# Patient Record
Sex: Female | Born: 1955 | Hispanic: No | Smoking: Never smoker
Health system: Southern US, Community
[De-identification: ages and names within clinical notes are randomized; demographics above are authoritative.]

## PROBLEM LIST (undated history)

## (undated) DIAGNOSIS — K449 Diaphragmatic hernia without obstruction or gangrene: Secondary | ICD-10-CM

## (undated) DIAGNOSIS — R32 Unspecified urinary incontinence: Secondary | ICD-10-CM

## (undated) DIAGNOSIS — E876 Hypokalemia: Secondary | ICD-10-CM

## (undated) DIAGNOSIS — J309 Allergic rhinitis, unspecified: Secondary | ICD-10-CM

## (undated) DIAGNOSIS — M25561 Pain in right knee: Secondary | ICD-10-CM

## (undated) DIAGNOSIS — M810 Age-related osteoporosis without current pathological fracture: Secondary | ICD-10-CM

## (undated) DIAGNOSIS — R609 Edema, unspecified: Secondary | ICD-10-CM

## (undated) DIAGNOSIS — E7800 Pure hypercholesterolemia, unspecified: Secondary | ICD-10-CM

## (undated) DIAGNOSIS — B0229 Other postherpetic nervous system involvement: Secondary | ICD-10-CM

## (undated) DIAGNOSIS — F32A Depression, unspecified: Secondary | ICD-10-CM

## (undated) DIAGNOSIS — R000 Tachycardia, unspecified: Secondary | ICD-10-CM

## (undated) DIAGNOSIS — I10 Essential (primary) hypertension: Secondary | ICD-10-CM

## (undated) DIAGNOSIS — K635 Polyp of colon: Secondary | ICD-10-CM

## (undated) DIAGNOSIS — K219 Gastro-esophageal reflux disease without esophagitis: Secondary | ICD-10-CM

## (undated) DIAGNOSIS — D509 Iron deficiency anemia, unspecified: Secondary | ICD-10-CM

## (undated) HISTORY — DX: Age-related osteoporosis without current pathological fracture: M81.0

## (undated) HISTORY — DX: Essential (primary) hypertension: I10

## (undated) HISTORY — DX: Pure hypercholesterolemia, unspecified: E78.00

## (undated) HISTORY — DX: Hypokalemia: E87.6

## (undated) HISTORY — DX: Diaphragmatic hernia without obstruction or gangrene: K44.9

## (undated) HISTORY — DX: Edema, unspecified: R60.9

## (undated) HISTORY — DX: Iron deficiency anemia, unspecified: D50.9

## (undated) HISTORY — DX: Unspecified urinary incontinence: R32

## (undated) HISTORY — DX: Polyp of colon: K63.5

## (undated) HISTORY — DX: Allergic rhinitis, unspecified: J30.9

## (undated) HISTORY — PX: TUBAL LIGATION: SHX77

## (undated) HISTORY — DX: Pain in right knee: M25.561

## (undated) HISTORY — DX: Gastro-esophageal reflux disease without esophagitis: K21.9

## (undated) HISTORY — DX: Tachycardia, unspecified: R00.0

## (undated) HISTORY — PX: CHOLECYSTECTOMY: SHX55

## (undated) HISTORY — DX: Depression, unspecified: F32.A

## (undated) HISTORY — DX: Other postherpetic nervous system involvement: B02.29

---

## 2001-02-24 ENCOUNTER — Other Ambulatory Visit: Admission: RE | Admit: 2001-02-24 | Discharge: 2001-02-24 | Payer: Self-pay | Admitting: Family Medicine

## 2002-03-30 ENCOUNTER — Other Ambulatory Visit: Admission: RE | Admit: 2002-03-30 | Discharge: 2002-03-30 | Payer: Self-pay | Admitting: Family Medicine

## 2003-04-05 ENCOUNTER — Other Ambulatory Visit: Admission: RE | Admit: 2003-04-05 | Discharge: 2003-04-05 | Payer: Self-pay | Admitting: Family Medicine

## 2004-08-14 ENCOUNTER — Other Ambulatory Visit: Admission: RE | Admit: 2004-08-14 | Discharge: 2004-08-14 | Payer: Self-pay | Admitting: Family Medicine

## 2005-08-27 ENCOUNTER — Other Ambulatory Visit: Admission: RE | Admit: 2005-08-27 | Discharge: 2005-08-27 | Payer: Self-pay | Admitting: Family Medicine

## 2021-01-27 ENCOUNTER — Encounter: Payer: Self-pay | Admitting: Gastroenterology

## 2021-05-21 ENCOUNTER — Other Ambulatory Visit: Payer: Self-pay

## 2021-05-21 DIAGNOSIS — M810 Age-related osteoporosis without current pathological fracture: Secondary | ICD-10-CM | POA: Insufficient documentation

## 2021-05-21 DIAGNOSIS — R000 Tachycardia, unspecified: Secondary | ICD-10-CM | POA: Insufficient documentation

## 2021-05-21 DIAGNOSIS — B0229 Other postherpetic nervous system involvement: Secondary | ICD-10-CM | POA: Insufficient documentation

## 2021-05-21 DIAGNOSIS — I10 Essential (primary) hypertension: Secondary | ICD-10-CM | POA: Insufficient documentation

## 2021-05-21 DIAGNOSIS — E7800 Pure hypercholesterolemia, unspecified: Secondary | ICD-10-CM | POA: Insufficient documentation

## 2021-05-21 DIAGNOSIS — J309 Allergic rhinitis, unspecified: Secondary | ICD-10-CM | POA: Insufficient documentation

## 2021-05-21 DIAGNOSIS — M25562 Pain in left knee: Secondary | ICD-10-CM | POA: Insufficient documentation

## 2021-05-21 DIAGNOSIS — K449 Diaphragmatic hernia without obstruction or gangrene: Secondary | ICD-10-CM | POA: Insufficient documentation

## 2021-05-21 DIAGNOSIS — R32 Unspecified urinary incontinence: Secondary | ICD-10-CM | POA: Insufficient documentation

## 2021-05-21 DIAGNOSIS — M25561 Pain in right knee: Secondary | ICD-10-CM | POA: Insufficient documentation

## 2021-05-21 DIAGNOSIS — D509 Iron deficiency anemia, unspecified: Secondary | ICD-10-CM | POA: Insufficient documentation

## 2021-05-21 DIAGNOSIS — F32A Depression, unspecified: Secondary | ICD-10-CM | POA: Insufficient documentation

## 2021-05-21 DIAGNOSIS — E876 Hypokalemia: Secondary | ICD-10-CM | POA: Insufficient documentation

## 2021-05-21 DIAGNOSIS — R609 Edema, unspecified: Secondary | ICD-10-CM | POA: Insufficient documentation

## 2021-05-21 DIAGNOSIS — K219 Gastro-esophageal reflux disease without esophagitis: Secondary | ICD-10-CM | POA: Insufficient documentation

## 2021-05-21 DIAGNOSIS — K635 Polyp of colon: Secondary | ICD-10-CM | POA: Insufficient documentation

## 2021-06-02 ENCOUNTER — Ambulatory Visit (INDEPENDENT_AMBULATORY_CARE_PROVIDER_SITE_OTHER): Payer: 59 | Admitting: Cardiology

## 2021-06-02 ENCOUNTER — Other Ambulatory Visit: Payer: Self-pay

## 2021-06-02 ENCOUNTER — Ambulatory Visit (INDEPENDENT_AMBULATORY_CARE_PROVIDER_SITE_OTHER): Payer: 59

## 2021-06-02 ENCOUNTER — Encounter: Payer: Self-pay | Admitting: Cardiology

## 2021-06-02 DIAGNOSIS — R0609 Other forms of dyspnea: Secondary | ICD-10-CM

## 2021-06-02 DIAGNOSIS — R002 Palpitations: Secondary | ICD-10-CM

## 2021-06-02 DIAGNOSIS — I471 Supraventricular tachycardia, unspecified: Secondary | ICD-10-CM

## 2021-06-02 DIAGNOSIS — E66811 Obesity, class 1: Secondary | ICD-10-CM

## 2021-06-02 DIAGNOSIS — R011 Cardiac murmur, unspecified: Secondary | ICD-10-CM

## 2021-06-02 DIAGNOSIS — E669 Obesity, unspecified: Secondary | ICD-10-CM

## 2021-06-02 DIAGNOSIS — R0600 Dyspnea, unspecified: Secondary | ICD-10-CM

## 2021-06-02 HISTORY — DX: Cardiac murmur, unspecified: R01.1

## 2021-06-02 HISTORY — DX: Other forms of dyspnea: R06.09

## 2021-06-02 HISTORY — DX: Obesity, unspecified: E66.9

## 2021-06-02 HISTORY — DX: Palpitations: R00.2

## 2021-06-02 HISTORY — DX: Supraventricular tachycardia, unspecified: I47.10

## 2021-06-02 HISTORY — DX: Obesity, class 1: E66.811

## 2021-06-02 HISTORY — DX: Supraventricular tachycardia: I47.1

## 2021-06-02 NOTE — Progress Notes (Signed)
Cardiology Office Note:    Date:  06/02/2021   ID:  Charlene Hudson, DOB [birth year], MRN [id number]  PCP:  Lonie Peak, PA-C  Cardiologist:  Garwin Brothers, MD   Referring MD: Lonie Peak, PA-C    ASSESSMENT:    1. Palpitations   2. Dyspnea on exertion   3. Obesity (BMI 30.0-34.9)   4. Cardiac murmur   5. PSVT (paroxysmal supraventricular tachycardia) (HCC)    PLAN:    In order of problems listed above:  Primary prevention stressed with the patient.  Importance of compliance with diet medication stressed and she vocalized understanding. Dyspnea on exertion: This could be an anginal equivalent.  She has multiple risk factors for coronary artery disease so we will do an exercise stress Cardiolite and she is agreeable. Cardiac murmur: Echocardiogram will be done to assess murmur heard on auscultation. Paroxysmal supraventricular tachycardia: We will try to get a copy of the EKG from primary care.  At any we will do a 2-week monitoring.  I will also like to see what her TSH looks like and lab work has also been requested.  From primary care office.  I asked her to consider buying a cardia app for evaluation of the symptoms especially since she gets them sporadically.  She is agreeable. Obesity: Weight reduction stressed diet emphasized and risks of obesity explained and she plans to do better. Patient will be seen in follow-up appointment in 6 months or earlier if the patient has any concerns    Medication Adjustments/Labs and Tests Ordered: Current medicines are reviewed at length with the patient today.  Concerns regarding medicines are outlined above.  No orders of the defined types were placed in this encounter.  No orders of the defined types were placed in this encounter.    History of Present Illness:    Charlene Hudson is a 65 y.o. female who is being seen today for the evaluation of palpitations and dyspnea on exertion at the request of Lonie Peak, Cordelia Poche.   Patient is a pleasant 65 year old female.  She has past medical history that is not much significant.  She was found to have supraventricular tachycardia that responded to carotid massage at primary care provider's office.  Therefore she is sent here for evaluation.  I do not have the EKG tracings from primary care and I am trying to get a copy of it.  She denies any chest pain orthopnea or PND.  No syncope.  She has palpitations occasionally.  She has dyspnea on exertion and this has been happening for the past few months.  At the time of my evaluation, the patient is alert awake oriented and in no distress.  Past Medical History:  Diagnosis Date   Allergic rhinitis    Depression    Edema    GERD (gastroesophageal reflux disease)    Hypertension, essential, benign    Hypokalemia    Intestinal polyp    Iron deficiency anemia    Knee pain, bilateral    Osteoporosis    Postherpetic neuralgia    Pure hypercholesterolemia    Sliding hiatal hernia    Tachycardia    Urinary incontinence     Past Surgical History:  Procedure Laterality Date   CHOLECYSTECTOMY     TUBAL LIGATION      Current Medications: Current Meds  Medication Sig   albuterol (VENTOLIN HFA) 108 (90 Base) MCG/ACT inhaler Inhale 2 puffs into the lungs every 4 (four) hours as needed for wheezing  or shortness of breath.   B Complex-C (SUPER B COMPLEX PO) Take 1 capsule by mouth daily.   Biotin 5000 MCG CAPS Take 5,000 mcg by mouth daily.   buPROPion (WELLBUTRIN XL) 150 MG 24 hr tablet Take 150 mg by mouth daily.   Calcium Carbonate-Vitamin D (CALCIUM-D PO) Take 1 capsule by mouth daily.   cholecalciferol (VITAMIN D3) 25 MCG (1000 UNIT) tablet Take 1,000 Units by mouth daily.   clotrimazole-betamethasone (LOTRISONE) cream Apply 1 application topically 2 (two) times daily.   DULoxetine (CYMBALTA) 60 MG capsule Take 60 mg by mouth daily.   fluticasone (FLONASE) 50 MCG/ACT nasal spray Place 2 sprays into both nostrils daily.    furosemide (LASIX) 40 MG tablet Take 40 mg by mouth daily.   ibuprofen (ADVIL) 800 MG tablet Take 800 mg by mouth 3 (three) times daily as needed for pain.   meclizine (ANTIVERT) 25 MG tablet Take 25 mg by mouth daily as needed for dizziness.   metoprolol succinate (TOPROL-XL) 25 MG 24 hr tablet Take 25 mg by mouth daily.   montelukast (SINGULAIR) 10 MG tablet Take 10 mg by mouth daily.   Multiple Vitamin (MULTIVITAMIN) tablet Take 1 tablet by mouth daily.   Omega-3 Fatty Acids (FISH OIL) 1200 MG CAPS Take 1,400 mg by mouth daily.   oxybutynin (DITROPAN) 5 MG tablet Take 5 mg by mouth daily.   pantoprazole (PROTONIX) 40 MG tablet Take 40 mg by mouth 2 (two) times daily.   potassium chloride SA (KLOR-CON) 20 MEQ tablet Take 20 mEq by mouth 2 (two) times daily.   pregabalin (LYRICA) 200 MG capsule Take 200 mg by mouth 2 (two) times daily.     Allergies:   Patient has no known allergies.   Social History   Socioeconomic History   Marital status: Unknown    Spouse name: Not on file   Number of children: Not on file   Years of education: Not on file   Highest education level: Not on file  Occupational History   Not on file  Tobacco Use   Smoking status: Never   Smokeless tobacco: Never  Substance and Sexual Activity   Alcohol use: Not on file   Drug use: Not on file   Sexual activity: Not on file  Other Topics Concern   Not on file  Social History Narrative   Not on file   Social Determinants of Health   Financial Resource Strain: Not on file  Food Insecurity: Not on file  Transportation Needs: Not on file  Physical Activity: Not on file  Stress: Not on file  Social Connections: Not on file     Family History: The patient's family history includes Cancer in her maternal grandfather; Heart attack in her father; Hypertension in her brother, brother, and sister. There is no history of Heart disease or Diabetes.  ROS:   Please see the history of present illness.    All  other systems reviewed and are negative.  EKGs/Labs/Other Studies Reviewed:    The following studies were reviewed today: EKG reveals sinus rhythm and nonspecific ST-T changes   Recent Labs: No results found for requested labs within last 8760 hours.  Recent Lipid Panel No results found for: CHOL, TRIG, HDL, CHOLHDL, VLDL, LDLCALC, LDLDIRECT  Physical Exam:    VS:  BP 118/80   Pulse 66   Ht 5\' 3"  (1.6 m)   Wt 177 lb 6.4 oz (80.5 kg)   SpO2 94%   BMI 31.42 kg/m  Wt Readings from Last 3 Encounters:  06/02/21 177 lb 6.4 oz (80.5 kg)     GEN: Patient is in no acute distress HEENT: Normal NECK: No JVD; No carotid bruits LYMPHATICS: No lymphadenopathy CARDIAC: S1 S2 regular, 2/6 systolic murmur at the apex. RESPIRATORY:  Clear to auscultation without rales, wheezing or rhonchi  ABDOMEN: Soft, non-tender, non-distended MUSCULOSKELETAL:  No edema; No deformity  SKIN: Warm and dry NEUROLOGIC:  Alert and oriented x 3 PSYCHIATRIC:  Normal affect    Signed, Garwin Brothers, MD  06/02/2021 3:12 PM    [facility]

## 2021-06-02 NOTE — Patient Instructions (Signed)
Medication Instructions:  Your physician recommends that you continue on your current medications as directed. Please refer to the Current Medication list given to you today.  *If you need a refill on your cardiac medications before your next appointment, please call your pharmacy*   Lab Work: None ordered If you have labs (blood work) drawn today and your tests are completely normal, you will receive your results only by: MyChart Message (if you have MyChart) OR A paper copy in the mail If you have any lab test that is abnormal or we need to change your treatment, we will call you to review the results.   Testing/Procedures: Your physician has requested that you have a lexiscan myoview. For further information please visit [email or web address] Please follow instruction sheet, as given.  The test will take approximately 3 to 4 hours to complete; you may bring reading material.  If someone comes with you to your appointment, they will need to remain in the main lobby due to limited space in the testing area.   How to prepare for your Myocardial Perfusion Test: Do not eat or drink 3 hours prior to your test, except you may have water. Do not consume products containing caffeine (regular or decaffeinated) 12 hours prior to your test. (ex: coffee, chocolate, sodas, tea). Do bring a list of your current medications with you.  If not listed below, you may take your medications as normal. Hold your Metoprolol succinate (Toprol XL) Do wear comfortable clothes (no dresses or overalls) and walking shoes, tennis shoes preferred (No heels or open toe shoes are allowed). Do NOT wear cologne, perfume, aftershave, or lotions (deodorant is allowed). If these instructions are not followed, your test will have to be rescheduled.  Your physician has requested that you have an echocardiogram. Echocardiography is a painless test that uses sound waves to create images of your heart. It provides your doctor  with information about the size and shape of your heart and how well your heart's chambers and valves are working. This procedure takes approximately one hour. There are no restrictions for this procedure.  Wear the monitor for 14 days, remove 06/16/21.  Follow-Up: At [facility], you and your health needs are our priority.  As part of our continuing mission to provide you with exceptional heart care, we have created designated Provider Care Teams.  These Care Teams include your primary Cardiologist (physician) and Advanced Practice Providers (APPs -  Physician Assistants and Nurse Practitioners) who all work together to provide you with the care you need, when you need it.  We recommend signing up for the patient portal called "MyChart".  Sign up information is provided on this After Visit Summary.  MyChart is used to connect with patients for Virtual Visits (Telemedicine).  Patients are able to view lab/test results, encounter notes, upcoming appointments, etc.  Non-urgent messages can be sent to your provider as well.   To learn more about what you can do with MyChart, go to [email or web address].    Your next appointment:   6 month(s)  The format for your next appointment:   In Person  Provider:   Belva Crome, MD   Other Instructions Cardiac Nuclear Scan A cardiac nuclear scan is a test that is done to check the flow of blood to your heart. It is done when you are resting and when you are exercising. The test looks for problems such as: Not enough blood reaching a portion of the heart. The heart muscle not working as  it should. You may need this test if: You have heart disease. You have had lab results that are not normal. You have had heart surgery or a balloon procedure to open up blocked arteries (angioplasty). You have chest pain. You have shortness of breath. In this test, a special dye (tracer) is put into your bloodstream. The tracer will travel to your heart. A  camera will then take pictures of your heart to see how the tracer moves through your heart. This test is usually done at a hospital and takes 2-4 hours. Tell a doctor about: Any allergies you have. All medicines you are taking, including vitamins, herbs, eye drops, creams, and over-the-counter medicines. Any problems you or family members have had with anesthetic medicines. Any blood disorders you have. Any surgeries you have had. Any medical conditions you have. Whether you are pregnant or may be pregnant. What are the risks? Generally, this is a safe test. However, problems may occur, such as: Serious chest pain and heart attack. This is only a risk if the stress portion of the test is done. Rapid heartbeat. A feeling of warmth in your chest. This feeling usually does not last long. Allergic reaction to the tracer. What happens before the test? Ask your doctor about changing or stopping your normal medicines. This is important. Follow instructions from your doctor about what you cannot eat or drink. Remove your jewelry on the day of the test. What happens during the test? An IV tube will be inserted into one of your veins. Your doctor will give you a small amount of tracer through the IV tube. You will wait for 20-40 minutes while the tracer moves through your bloodstream. Your heart will be monitored with an electrocardiogram (ECG). You will lie down on an exam table. Pictures of your heart will be taken for about 15-20 minutes. You may also have a stress test. For this test, one of these things may be done: You will be asked to exercise on a treadmill or a stationary bike. You will be given medicines that will make your heart work harder. This is done if you are unable to exercise. When blood flow to your heart has peaked, a tracer will again be given through the IV tube. After 20-40 minutes, you will get back on the exam table. More pictures will be taken of your heart. Depending  on the tracer that is used, more pictures may need to be taken 3-4 hours later. Your IV tube will be removed when the test is over. The test may vary among doctors and hospitals. What happens after the test? Ask your doctor: Whether you can return to your normal schedule, including diet, activities, and medicines. Whether you should drink more fluids. This will help to remove the tracer from your body. Drink enough fluid to keep your pee (urine) pale yellow. Ask your doctor, or the department that is doing the test: When will my results be ready? How will I get my results? Summary A cardiac nuclear scan is a test that is done to check the flow of blood to your heart. Tell your doctor whether you are pregnant or may be pregnant. Before the test, ask your doctor about changing or stopping your normal medicines. This is important. Ask your doctor whether you can return to your normal activities. You may be asked to drink more fluids. This information is not intended to replace advice given to you by your health care provider. Make sure you discuss any   questions you have with your health care provider. Document Revised: 12/27/2018 Document Reviewed: 02/20/2018 Elsevier Patient Education  2021 Elsevier Inc.    Echocardiogram An echocardiogram is a test that uses sound waves (ultrasound) to produce images of the heart. Images from an echocardiogram can provide important information about: Heart size and shape. The size and thickness and movement of your heart's walls. Heart muscle function and strength. Heart valve function or if you have stenosis. Stenosis is when the heart valves are too narrow. If blood is flowing backward through the heart valves (regurgitation). A tumor or infectious growth around the heart valves. Areas of heart muscle that are not working well because of poor blood flow or injury from a heart attack. Aneurysm detection. An aneurysm is a weak or damaged part of an  artery wall. The wall bulges out from the normal force of blood pumping through the body. Tell a health care provider about: Any allergies you have. All medicines you are taking, including vitamins, herbs, eye drops, creams, and over-the-counter medicines. Any blood disorders you have. Any surgeries you have had. Any medical conditions you have. Whether you are pregnant or may be pregnant. What are the risks? Generally, this is a safe test. However, problems may occur, including an allergic reaction to dye (contrast) that may be used during the test. What happens before the test? No specific preparation is needed. You may eat and drink normally. What happens during the test? You will take off your clothes from the waist up and put on a hospital gown. Electrodes or electrocardiogram (ECG)patches may be placed on your chest. The electrodes or patches are then connected to a device that monitors your heart rate and rhythm. You will lie down on a table for an ultrasound exam. A gel will be applied to your chest to help sound waves pass through your skin. A handheld device, called a transducer, will be pressed against your chest and moved over your heart. The transducer produces sound waves that travel to your heart and bounce back (or "echo" back) to the transducer. These sound waves will be captured in real-time and changed into images of your heart that can be viewed on a video monitor. The images will be recorded on a computer and reviewed by your health care provider. You may be asked to change positions or hold your breath for a short time. This makes it easier to get different views or better views of your heart. In some cases, you may receive contrast through an IV in one of your veins. This can improve the quality of the pictures from your heart. The procedure may vary among health care providers and hospitals.    What can I expect after the test? You may return to your normal, everyday  life, including diet, activities, and medicines, unless your health care provider tells you not to do that. Follow these instructions at home: It is up to you to get the results of your test. Ask your health care provider, or the department that is doing the test, when your results will be ready. Keep all follow-up visits. This is important. Summary An echocardiogram is a test that uses sound waves (ultrasound) to produce images of the heart. Images from an echocardiogram can provide important information about the size and shape of your heart, heart muscle function, heart valve function, and other possible heart problems. You do not need to do anything to prepare before this test. You may eat and drink normally. After   the echocardiogram is completed, you may return to your normal, everyday life, unless your health care provider tells you not to do that. This information is not intended to replace advice given to you by your health care provider. Make sure you discuss any questions you have with your health care provider. Document Revised: 04/29/2020 Document Reviewed: 04/29/2020 Elsevier Patient Education  2021 Elsevier Inc.  

## 2021-06-16 ENCOUNTER — Telehealth: Payer: Self-pay

## 2021-06-16 NOTE — Telephone Encounter (Signed)
Detailed instructions left on the patient's answering machine. Asked to call back with any questions. S.Mackensi Mahadeo EMTP 

## 2021-06-18 ENCOUNTER — Other Ambulatory Visit: Payer: Self-pay

## 2021-06-18 ENCOUNTER — Ambulatory Visit (INDEPENDENT_AMBULATORY_CARE_PROVIDER_SITE_OTHER): Payer: 59

## 2021-06-18 DIAGNOSIS — R0600 Dyspnea, unspecified: Secondary | ICD-10-CM

## 2021-06-18 DIAGNOSIS — R011 Cardiac murmur, unspecified: Secondary | ICD-10-CM | POA: Diagnosis not present

## 2021-06-18 DIAGNOSIS — R0609 Other forms of dyspnea: Secondary | ICD-10-CM

## 2021-06-18 LAB — ECHOCARDIOGRAM COMPLETE
Area-P 1/2: 3.3 cm2
S' Lateral: 2.8 cm

## 2021-06-18 MED ORDER — TECHNETIUM TC 99M TETROFOSMIN IV KIT
28.6000 | PACK | Freq: Once | INTRAVENOUS | Status: AC | PRN
  Administered 2021-06-18: 28.6 via INTRAVENOUS

## 2021-06-18 MED ORDER — TECHNETIUM TC 99M TETROFOSMIN IV KIT
10.9000 | PACK | Freq: Once | INTRAVENOUS | Status: AC | PRN
  Administered 2021-06-18: 10.9 via INTRAVENOUS

## 2021-06-18 MED ORDER — REGADENOSON 0.4 MG/5ML IV SOLN
0.4000 mg | Freq: Once | INTRAVENOUS | Status: AC
  Administered 2021-06-18: 0.4 mg via INTRAVENOUS

## 2021-06-18 MED ORDER — PERFLUTREN LIPID MICROSPHERE
1.0000 mL | INTRAVENOUS | Status: AC | PRN
  Administered 2021-06-18: 2 mL via INTRAVENOUS

## 2021-06-19 ENCOUNTER — Telehealth: Payer: Self-pay | Admitting: Cardiology

## 2021-06-19 NOTE — Telephone Encounter (Signed)
Left VM for Charlene Hudson to callback as her mother requested we call her with the pt's results.

## 2021-06-19 NOTE — Telephone Encounter (Signed)
Patient called in asking that the nurse giver a daughter a call on the results of her test. Daughter info is on file already. Please advise

## 2021-06-23 LAB — MYOCARDIAL PERFUSION IMAGING
LV dias vol: 83 mL (ref 46–106)
LV sys vol: 30 mL
Nuc Stress EF: 64 %
Peak HR: 71 {beats}/min
Rest HR: 53 {beats}/min
Rest Nuclear Isotope Dose: 10.9 mCi
SDS: 0
SRS: 1
SSS: 1
Stress Nuclear Isotope Dose: 28.6 mCi
TID: 1.01

## 2021-08-19 ENCOUNTER — Telehealth: Payer: Self-pay

## 2021-08-19 NOTE — Telephone Encounter (Signed)
I have called and left a message for patient to call back and schedule an appointment with pre-visit for recall colonoscopy. 

## 2021-12-04 ENCOUNTER — Other Ambulatory Visit: Payer: Self-pay

## 2021-12-04 ENCOUNTER — Encounter: Payer: Self-pay | Admitting: Cardiology

## 2021-12-04 ENCOUNTER — Ambulatory Visit (INDEPENDENT_AMBULATORY_CARE_PROVIDER_SITE_OTHER): Payer: 59 | Admitting: Cardiology

## 2021-12-04 VITALS — BP 142/86 | HR 84 | Ht 63.0 in | Wt 186.8 lb

## 2021-12-04 DIAGNOSIS — R0609 Other forms of dyspnea: Secondary | ICD-10-CM

## 2021-12-04 DIAGNOSIS — I10 Essential (primary) hypertension: Secondary | ICD-10-CM

## 2021-12-04 DIAGNOSIS — E7800 Pure hypercholesterolemia, unspecified: Secondary | ICD-10-CM | POA: Diagnosis not present

## 2021-12-04 DIAGNOSIS — R002 Palpitations: Secondary | ICD-10-CM | POA: Diagnosis not present

## 2021-12-04 DIAGNOSIS — I471 Supraventricular tachycardia: Secondary | ICD-10-CM

## 2021-12-04 NOTE — Patient Instructions (Signed)

## 2021-12-04 NOTE — Addendum Note (Signed)
Addended by: Eleonore Chiquito on: 12/04/2021 03:30 PM ? ? Modules accepted: Orders ? ?

## 2021-12-04 NOTE — Progress Notes (Signed)
?Cardiology Office Note:   ? ?Date:  12/04/2021  ? ?ID:  Charlene Hudson, DOB March 28, 1956, MRN [id number] ? ?PCP:  Lonie Peak, PA-C  ?Cardiologist:  Garwin Brothers, MD  ? ?Referring MD: Lonie Peak, PA-C  ? ? ?ASSESSMENT:   ? ?No diagnosis found. ?PLAN:   ? ?In order of problems listed above: ? ?Primary prevention stressed with the patient.  Importance of compliance with diet medication stressed and she vocalized understanding ?Dyspnea on exertion: I am concerned about these findings.  I would like to do a D-dimer on her today at [facility] hospital.  If this is positive then obviously she will need to be evaluated for thromboembolism issues.  If it is negative then CT coronary angiography with FFR would be desirable to understand why she gets short of breath on exertion and at times without exertion.  I am concerned about the possibility of obstructive coronary artery disease. ?Essential hypertension: Blood pressure is stable and diet was emphasized. ?Obesity: Weight reduction stressed and she understands.  Lifestyle modification urged. ?Patient will be seen in follow-up appointment in 6 months or earlier if the patient has any concerns ? ? ? ?Medication Adjustments/Labs and Tests Ordered: ?Current medicines are reviewed at length with the patient today.  Concerns regarding medicines are outlined above.  ?No orders of the defined types were placed in this encounter. ? ?No orders of the defined types were placed in this encounter. ? ? ? ?No chief complaint on file. ?  ? ?History of Present Illness:   ? ?Charlene Hudson is a 66 y.o. female.  Patient has past medical history of essential hypertension, obesity and asthma.  She mentions to me that she does physical work.  She was evaluated for dyspnea on exertion.  Her stress test was unremarkable.  She is here for follow-up.  Her daughter is a Engineer, civil (consulting) and accompanies her for this visit.  She tells me that her mother gets short of breath at times and this is out of  proportion to what she is doing.  At the time of my evaluation, the patient is alert awake oriented and in no distress. ? ?Past Medical History:  ?Diagnosis Date  ? Allergic rhinitis   ? Cardiac murmur 06/02/2021  ? Depression   ? Dyspnea on exertion 06/02/2021  ? Edema   ? GERD (gastroesophageal reflux disease)   ? Hypertension, essential, benign   ? Hypokalemia   ? Intestinal polyp   ? Iron deficiency anemia   ? Knee pain, bilateral   ? Obesity (BMI 30.0-34.9) 06/02/2021  ? Osteoporosis   ? Palpitations 06/02/2021  ? Postherpetic neuralgia   ? PSVT (paroxysmal supraventricular tachycardia) (HCC) 06/02/2021  ? Pure hypercholesterolemia   ? Sliding hiatal hernia   ? Tachycardia   ? Urinary incontinence   ? ? ?Past Surgical History:  ?Procedure Laterality Date  ? CHOLECYSTECTOMY    ? TUBAL LIGATION    ? ? ?Current Medications: ?Current Meds  ?Medication Sig  ? albuterol (VENTOLIN HFA) 108 (90 Base) MCG/ACT inhaler Inhale 2 puffs into the lungs every 4 (four) hours as needed for wheezing or shortness of breath.  ? B Complex-C (SUPER B COMPLEX PO) Take 1 capsule by mouth daily.  ? Biotin 5000 MCG CAPS Take 5,000 mcg by mouth daily.  ? buPROPion (WELLBUTRIN XL) 150 MG 24 hr tablet Take 150 mg by mouth daily.  ? Calcium Carbonate-Vitamin D (CALCIUM-D PO) Take 1 capsule by mouth daily.  ? cholecalciferol (VITAMIN D3)  25 MCG (1000 UNIT) tablet Take 1,000 Units by mouth daily.  ? clotrimazole-betamethasone (LOTRISONE) cream Apply 1 application topically 2 (two) times daily.  ? DULoxetine (CYMBALTA) 60 MG capsule Take 60 mg by mouth daily.  ? fluticasone (FLONASE) 50 MCG/ACT nasal spray Place 2 sprays into both nostrils daily.  ? furosemide (LASIX) 40 MG tablet Take 40 mg by mouth daily.  ? ibuprofen (ADVIL) 800 MG tablet Take 800 mg by mouth 3 (three) times daily as needed for pain.  ? meclizine (ANTIVERT) 25 MG tablet Take 25 mg by mouth daily as needed for dizziness.  ? metoprolol succinate (TOPROL-XL) 25 MG 24 hr tablet Take  25 mg by mouth daily.  ? montelukast (SINGULAIR) 10 MG tablet Take 10 mg by mouth daily.  ? Multiple Vitamin (MULTIVITAMIN) tablet Take 1 tablet by mouth daily.  ? Omega-3 Fatty Acids (FISH OIL) 1200 MG CAPS Take 1,400 mg by mouth daily.  ? oxybutynin (DITROPAN) 5 MG tablet Take 5 mg by mouth daily.  ? pantoprazole (PROTONIX) 40 MG tablet Take 40 mg by mouth 2 (two) times daily.  ? potassium chloride SA (KLOR-CON) 20 MEQ tablet Take 20 mEq by mouth 2 (two) times daily.  ? prednisoLONE acetate (PRED FORTE) 1 % ophthalmic suspension Place 2 drops into the left eye daily.  ? pregabalin (LYRICA) 200 MG capsule Take 200 mg by mouth 2 (two) times daily.  ?  ? ?Allergies:   Patient has no known allergies.  ? ?Social History  ? ?Socioeconomic History  ? Marital status: Unknown  ?  Spouse name: Not on file  ? Number of children: Not on file  ? Years of education: Not on file  ? Highest education level: Not on file  ?Occupational History  ? Not on file  ?Tobacco Use  ? Smoking status: Never  ? Smokeless tobacco: Never  ?Substance and Sexual Activity  ? Alcohol use: Not on file  ? Drug use: Not on file  ? Sexual activity: Not on file  ?Other Topics Concern  ? Not on file  ?Social History Narrative  ? Not on file  ? ?Social Determinants of Health  ? ?Financial Resource Strain: Not on file  ?Food Insecurity: Not on file  ?Transportation Needs: Not on file  ?Physical Activity: Not on file  ?Stress: Not on file  ?Social Connections: Not on file  ?  ? ?Family History: ?The patient's family history includes Cancer in her maternal grandfather; Heart attack in her father; Hypertension in her brother, brother, and sister. There is no history of Heart disease or Diabetes. ? ?ROS:   ?Please see the history of present illness.    ?All other systems reviewed and are negative. ? ?EKGs/Labs/Other Studies Reviewed:   ? ?The following studies were reviewed today: ?Study Highlights ? ?  ?Patch Wear Time:  13 days and 3 hours  (2022-09-13T15:36:33-0400 to 2022-09-26T19:36:20-0400) ?  ?Patient had a min HR of 48 bpm, max HR of 190 bpm, and avg HR of 76 bpm.  ?  ?Predominant underlying rhythm was Sinus Rhythm. Slight P wave morphology changes were noted. 20 Supraventricular Tachycardia runs occurred, the run with the fastest interval lasting 13 beats with a max rate of 190 bpm, the longest lasting 9 beats with an avg rate of 107 bpm. Isolated SVEs were rare (<1.0%), SVE Couplets were rare (<1.0%), and SVE Triplets were rare (<1.0%). ?  ? Isolated VEs were rare (<1.0%), VE Couplets were rare (<1.0%), and no VE Triplets were present.  Ventricular Trigeminy was present. ?  ?Impression: Mildly abnormal monitoring with brief atrial runs. ? ? ? ?Study Highlights ? ?  ?  The study is normal. The study is low risk. ?  Left ventricular function is normal. Nuclear stress EF: 64 %. The left ventricular ejection fraction is normal (55-65%). End diastolic cavity size is normal. ?  Prior study not available for comparison. ? ? ?IMPRESSIONS  ? ? ? 1. Left ventricular ejection fraction, by estimation, is 60 to 65%. The  ?left ventricle has normal function. The left ventricle has no regional  ?wall motion abnormalities. There is mild concentric left ventricular  ?hypertrophy. Left ventricular diastolic  ?parameters are consistent with Grade I diastolic dysfunction (impaired  ?relaxation).  ? 2. Right ventricular systolic function is normal. The right ventricular  ?size is normal. There is normal pulmonary artery systolic pressure.  ? 3. Left atrial size was mildly dilated.  ? 4. The mitral valve is normal in structure. Trivial mitral valve  ?regurgitation. No evidence of mitral stenosis.  ? 5. The aortic valve is tricuspid. Aortic valve regurgitation is not  ?visualized. No aortic stenosis is present.  ? 6. The inferior vena cava is normal in size with greater than 50%  ?respiratory variability, suggesting right atrial pressure of 3 mmHg.  ?  ? ? ?Recent  Labs: ?No results found for requested labs within last 8760 hours.  ?Recent Lipid Panel ?No results found for: CHOL, TRIG, HDL, CHOLHDL, VLDL, LDLCALC, LDLDIRECT ? ?Physical Exam:   ? ?VS:  BP (!) 142/86   Pulse 84   Ht 5\' 3"  (1

## 2021-12-08 ENCOUNTER — Telehealth: Payer: Self-pay

## 2021-12-08 NOTE — Telephone Encounter (Signed)
? ?  Pre-operative Risk Assessment  ?  ?Patient Name: Charlene Hudson  ?DOB: 1955/11/29 ?MRN: [id number]  ? ?  ? ?Request for Surgical Clearance   ? ?Procedure:   cataract surgery ? ?Date of Surgery:  Clearance 12/15/21                              ?   ?Surgeon:  Dr. Sheffield Slider ?Surgeon's Group or Practice Name:  [facility] ?Phone number:  [phone] ?Fax number:  [phone] ?  ?Type of Clearance Requested:   ?- Medical  ?  ?Type of Anesthesia:   IV sedation ?  ?Additional requests/questions:   ? ?Signed, ?Trissa Molina Myrtie Soman   ?12/08/2021, 2:24 PM  ? ?

## 2021-12-08 NOTE — Telephone Encounter (Signed)
Dr. Tomie China ?Looks like you saw this patient on 12/04/2021 and was concerned for dyspnea on exertion.  He recommended D-dimer if negative then he recommended CT coronary with FFR.  I do not see these results. ? ?We are asked for cataract surgical clearance with IV sedation.  Can you please comment on clearance for this procedure?  Generally cataract surgery is deemed low risk, but she is pending CCTA. ?

## 2021-12-10 ENCOUNTER — Telehealth: Payer: Self-pay | Admitting: *Deleted

## 2021-12-10 NOTE — Telephone Encounter (Signed)
Left detailed message for the pt to call back and ask to s/w the pre op team. Per Dr. Tomie China he would like the pt to have a tele pre op appt for assessment before clearing the pt.  ?

## 2021-12-10 NOTE — Telephone Encounter (Signed)
Patient returned call

## 2021-12-10 NOTE — Telephone Encounter (Signed)
Patient is returning call. Requesting callback after 4:30.  ?

## 2021-12-10 NOTE — Telephone Encounter (Signed)
PT AGREEABLE TO PLAN OF CARE FOR TELE PRE OP APPT 12/11/21. MED REC AND CONSENT ARE DONE.  ? ?  ?Patient Consent for Virtual Visit  ? ? ?   ? ?Charlene Hudson has provided verbal consent on 12/10/2021 for a virtual visit (video or telephone). ? ? ?CONSENT FOR VIRTUAL VISIT FOR:  Charlene Hudson  ?By participating in this virtual visit I agree to the following: ? ?I hereby voluntarily request, consent and authorize [facility] and its employed or contracted physicians, physician assistants, nurse practitioners or other licensed health care professionals (the Practitioner), to provide me with telemedicine health care services (the ?Services") as deemed necessary by the treating Practitioner. I acknowledge and consent to receive the Services by the Practitioner via telemedicine. I understand that the telemedicine visit will involve communicating with the Practitioner through live audiovisual communication technology and the disclosure of certain medical information by electronic transmission. I acknowledge that I have been given the opportunity to request an in-person assessment or other available alternative prior to the telemedicine visit and am voluntarily participating in the telemedicine visit. ? ?I understand that I have the right to withhold or withdraw my consent to the use of telemedicine in the course of my care at any time, without affecting my right to future care or treatment, and that the Practitioner or I may terminate the telemedicine visit at any time. I understand that I have the right to inspect all information obtained and/or recorded in the course of the telemedicine visit and may receive copies of available information for a reasonable fee.  I understand that some of the potential risks of receiving the Services via telemedicine include:  ?Delay or interruption in medical evaluation due to technological equipment failure or disruption; ?Information transmitted may not be sufficient (e.g. poor  resolution of images) to allow for appropriate medical decision making by the Practitioner; and/or  ?In rare instances, security protocols could fail, causing a breach of personal health information. ? ?Furthermore, I acknowledge that it is my responsibility to provide information about my medical history, conditions and care that is complete and accurate to the best of my ability. I acknowledge that Practitioner's advice, recommendations, and/or decision may be based on factors not within their control, such as incomplete or inaccurate data provided by me or distortions of diagnostic images or specimens that may result from electronic transmissions. I understand that the practice of medicine is not an exact science and that Practitioner makes no warranties or guarantees regarding treatment outcomes. I acknowledge that a copy of this consent can be made available to me via my patient portal [facility] MyChart), or I can request a printed copy by calling the office of [facility].   ? ?I understand that my insurance will be billed for this visit.  ? ?I have read or had this consent read to me. ?I understand the contents of this consent, which adequately explains the benefits and risks of the Services being provided via telemedicine.  ?I have been provided ample opportunity to ask questions regarding this consent and the Services and have had my questions answered to my satisfaction. ?I give my informed consent for the services to be provided through the use of telemedicine in my medical care ? ? ? ?

## 2021-12-10 NOTE — Telephone Encounter (Signed)
Preoperative team, please contact this patient and set up a phone call appointment for further cardiac evaluation.  Thank you for your help. ? ?Jossie Ng. Latona Krichbaum NP-C ? ?  ?12/10/2021, 9:19 AM ?[place] ?[address] ?Office [phone] Fax [phone] ? ?

## 2021-12-10 NOTE — Telephone Encounter (Signed)
Sign at exiting of workspace    ?  ?PT AGREEABLE TO PLAN OF CARE FOR TELE PRE OP APPT 12/11/21. MED REC AND CONSENT ARE DONE.  ?   ?  ?  ?SEE SEPARATE PHONE NOTE FOR CONSENT AND MED REC ?

## 2021-12-10 NOTE — Telephone Encounter (Signed)
I tried to call the pt back though she did not answer ?

## 2021-12-11 ENCOUNTER — Ambulatory Visit (INDEPENDENT_AMBULATORY_CARE_PROVIDER_SITE_OTHER): Payer: 59 | Admitting: General Practice

## 2021-12-11 ENCOUNTER — Other Ambulatory Visit: Payer: Self-pay

## 2021-12-11 DIAGNOSIS — Z01810 Encounter for preprocedural cardiovascular examination: Secondary | ICD-10-CM

## 2021-12-11 NOTE — Progress Notes (Signed)
? ?Virtual Visit via Telephone Note  ? ?This visit type was conducted due to national recommendations for restrictions regarding the COVID-19 Pandemic (e.g. social distancing) in an effort to limit this patient's exposure and mitigate transmission in our community.  Due to her co-morbid illnesses, this patient is at least at moderate risk for complications without adequate follow up.  This format is felt to be most appropriate for this patient at this time.  The patient did not have access to video technology/had technical difficulties with video requiring transitioning to audio format only (telephone).  All issues noted in this document were discussed and addressed.  No physical exam could be performed with this format.  Please refer to the patient's chart for her  consent to telehealth for [facility]. ?Evaluation Performed:  Preoperative cardiovascular risk assessment ? ?This visit type was conducted due to national recommendations for restrictions regarding the COVID-19 Pandemic (e.g. social distancing).  This format is felt to be most appropriate for this patient at this time.  All issues noted in this document were discussed and addressed.  No physical exam was performed (except for noted visual exam findings with Video Visits).  Please refer to the patient's chart (MyChart message for video visits and phone note for telephone visits) for the patient's consent to telehealth [facility]. ?_____________  ? ?Date:  12/11/2021  ? ?Patient ID:  Charlene Hudson, DOB 03-21-1956, MRN [id number] ?Patient Location:  ?Home ?Provider location:   ?Office ? ?Primary Care Provider:  Lonie Peakonroy, Nathan, PA-C ?Primary Cardiologist:  Garwin Brothersajan R Revankar, MD ? ?Chief Complaint  ?  ?66 y.o. y/o female with a h/o hypertension, PSVT, hyperlipidemia, palpitations, DOE, who is pending cataract surgery, and presents today for telephonic preoperative cardiovascular risk assessment. ? ?Past Medical History  ?  ?Past Medical History:   ?Diagnosis Date  ? Allergic rhinitis   ? Cardiac murmur 06/02/2021  ? Depression   ? Dyspnea on exertion 06/02/2021  ? Edema   ? GERD (gastroesophageal reflux disease)   ? Hypertension, essential, benign   ? Hypokalemia   ? Intestinal polyp   ? Iron deficiency anemia   ? Knee pain, bilateral   ? Obesity (BMI 30.0-34.9) 06/02/2021  ? Osteoporosis   ? Palpitations 06/02/2021  ? Postherpetic neuralgia   ? PSVT (paroxysmal supraventricular tachycardia) (HCC) 06/02/2021  ? Pure hypercholesterolemia   ? Sliding hiatal hernia   ? Tachycardia   ? Urinary incontinence   ? ?Past Surgical History:  ?Procedure Laterality Date  ? CHOLECYSTECTOMY    ? TUBAL LIGATION    ? ? ?Allergies ? ?No Known Allergies ? ?History of Present Illness  ?  ?Charlene Hudson is a 66 y.o. female who presents via audio/video conferencing for a telehealth visit today.  Pt was last seen in cardiology clinic on 12/04/2021, by Dr. Tomie Chinaevankar.  At that time Charlene Hudson was experiencing shortness of breath.  Coronary CTA was ordered/recommended.  she is now pending cataract surgery.  Since his last visit, she continues to be able to perform all of her daily activities and reports her breathing is much improved.  [place] ? ? ?Today she denies chest pain, shortness of breath, lower extremity edema, fatigue, palpitations, melena, hematuria, hemoptysis, diaphoresis, weakness, presyncope, syncope, orthopnea, and PND. ? ? ?Home Medications  ?  ?Prior to Admission medications   ?Medication Sig Start Date End Date Taking? Authorizing Provider  ?albuterol (VENTOLIN HFA) 108 (90 Base) MCG/ACT inhaler  Inhale 2 puffs into the lungs every 4 (four) hours as needed for wheezing or shortness of breath.    [provider]  ?B Complex-C (SUPER B COMPLEX PO) Take 1 capsule by mouth daily.    [provider]  ?Biotin 5000 MCG CAPS Take 5,000 mcg by mouth daily.    [provider]  ?buPROPion (WELLBUTRIN XL) 150 MG 24  hr tablet Take 150 mg by mouth daily. 04/17/21   [provider]  ?Calcium Carbonate-Vitamin D (CALCIUM-D PO) Take 1 capsule by mouth daily.    [provider]  ?cholecalciferol (VITAMIN D3) 25 MCG (1000 UNIT) tablet Take 1,000 Units by mouth daily.    [provider]  ?clotrimazole-betamethasone (LOTRISONE) cream Apply 1 application topically 2 (two) times daily.    [provider]  ?DULoxetine (CYMBALTA) 60 MG capsule Take 60 mg by mouth daily. 04/17/21   [provider]  ?fluticasone (FLONASE) 50 MCG/ACT nasal spray Place 2 sprays into both nostrils daily. 05/14/21   [provider]  ?furosemide (LASIX) 40 MG tablet Take 40 mg by mouth daily. 04/17/21   [provider]  ?ibuprofen (ADVIL) 800 MG tablet Take 800 mg by mouth 3 (three) times daily as needed for pain. 04/17/21   [provider]  ?meclizine (ANTIVERT) 25 MG tablet Take 25 mg by mouth daily as needed for dizziness. 04/17/21   [provider]  ?metoprolol succinate (TOPROL-XL) 25 MG 24 hr tablet Take 25 mg by mouth daily. 05/14/21   [provider]  ?montelukast (SINGULAIR) 10 MG tablet Take 10 mg by mouth daily. 04/17/21   [provider]  ?Multiple Vitamin (MULTIVITAMIN) tablet Take 1 tablet by mouth daily.    [provider]  ?Omega-3 Fatty Acids (FISH OIL) 1200 MG CAPS Take 1,400 mg by mouth daily.    [provider]  ?oxybutynin (DITROPAN) 5 MG tablet Take 5 mg by mouth daily. 04/17/21   [provider]  ?pantoprazole (PROTONIX) 40 MG tablet Take 40 mg by mouth 2 (two) times daily. 04/17/21   [provider]  ?potassium chloride SA (KLOR-CON) 20 MEQ tablet Take 20 mEq by mouth 2 (two) times daily. 04/17/21   [provider]  ?prednisoLONE acetate (PRED FORTE) 1 % ophthalmic suspension Place 2 drops into the left eye daily.    [provider]  ?pregabalin (LYRICA) 200 MG capsule Take 200 mg by mouth 2 (two)  times daily. 04/17/21   [provider]  ? ? ?Physical Exam  ?  ?Vital Signs:  Charlene Hudson does not have vital signs available for review today. ? ?Given telephonic nature of communication, physical exam is limited. ?AAOx3. NAD. Normal affect.  Speech and respirations are unlabored. ? ?Accessory Clinical Findings  ?  ?None ? ?Assessment & Plan  ?  ?1.  Preoperative Cardiovascular Risk Assessment: ? ?  ? ?Primary Cardiologist: Garwin Brothers, MD ? ?Chart reviewed as part of pre-operative protocol coverage. Given past medical history and time since last visit, based on ACC/AHA guidelines, Charlene Hudson would be at acceptable risk for the planned procedure without further cardiovascular testing.  ? ?Patient was advised that if she develops new symptoms prior to surgery to contact our office to arrange a follow-up appointment.  He verbalized understanding. ? ? ?COVID-19 Education: ?The signs and symptoms of COVID-19 were discussed with the patient and how to seek care for testing (follow up with PCP or arrange E-visit).  The importance of social distancing  was discussed today. ? ?Patient Risk:   ?After full review of this patient's history and clinical status, I feel that he is at least moderate risk for cardiac complications at this time, thus necessitating a telehealth visit sooner than our first available in office visit. ? ?Time:   ?Today, I have spent 5 minutes with the patient with telehealth technology discussing medical history, symptoms, and management plan.  Prior to her phone visit I spent greater than 10 minutes reviewing the patient's past medical history and medications. ? ? ?Ronney Asters, NP ? ?12/11/2021, 10:38 AM ? ?

## 2021-12-17 ENCOUNTER — Telehealth: Payer: Self-pay | Admitting: Cardiology

## 2021-12-17 NOTE — Telephone Encounter (Signed)
Calling bout CT that is being ordered for the patient. ?

## 2021-12-17 NOTE — Telephone Encounter (Signed)
Sent to [facility] for review. ?

## 2021-12-22 NOTE — Telephone Encounter (Signed)
Pt aware that the cardiac CT has been ordered. Instructions have been reviewed and a letter sent. Pt verbalized understanding and had no additional questions. ?

## 2021-12-22 NOTE — Addendum Note (Signed)
Addended by: Eleonore Chiquito on: 12/22/2021 09:38 AM ? ? Modules accepted: Orders ? ?

## 2022-01-05 ENCOUNTER — Telehealth (HOSPITAL_COMMUNITY): Payer: Self-pay | Admitting: Emergency Medicine

## 2022-01-05 NOTE — Telephone Encounter (Signed)
Reaching out to patient to offer assistance regarding upcoming cardiac imaging study; pt verbalizes understanding of appt date/time, parking situation and where to check in, pre-test NPO status and medications ordered, and verified current allergies; name and call back number provided for further questions should they arise ?Marchia Bond RN Navigator Cardiac Imaging ?[facility] ?[phone] office ?[phone] cell ? ?Difficult IV ?Daily metop suc 2 hr prior to scan ?Arrival 1100 ? ?

## 2022-01-06 ENCOUNTER — Ambulatory Visit (HOSPITAL_COMMUNITY)
Admission: RE | Admit: 2022-01-06 | Discharge: 2022-01-06 | Disposition: A | Discharge status: Home / Self Care | Payer: 59 | Source: Ambulatory Visit | Attending: Cardiology | Admitting: Cardiology

## 2022-01-06 DIAGNOSIS — R0609 Other forms of dyspnea: Secondary | ICD-10-CM

## 2022-01-06 IMAGING — CT CT HEART MORP W/ CTA COR W/ SCORE W/ CA W/CM &/OR W/O CM
4 of 7 series · 8 of 20 positions shown, 9 images · IV contrast (APPLIED)
Comparison: None.
COMPARISON: None.

Addendum:
EXAM:
OVER-READ INTERPRETATION  CT CHEST

The following report is an over-read performed by radiologist Dr.
Ashely Jim [[email or web address]] on 01/06/2022. This over-read
does not include interpretation of cardiac or coronary anatomy or
pathology. The coronary calcium score/coronary CTA interpretation by
the cardiologist is attached.
CLINICAL DATA: 65F with ERUM
Cardiac/Coronary CTA
TECHNIQUE: The patient was scanned on a [employer] Force scanner.

[Series 6: ts diast sharp · axial · 0.39mm/px · z∈[+1274,+1312]mm · 2 of 286 slices shown]
[im 96/286  lung]
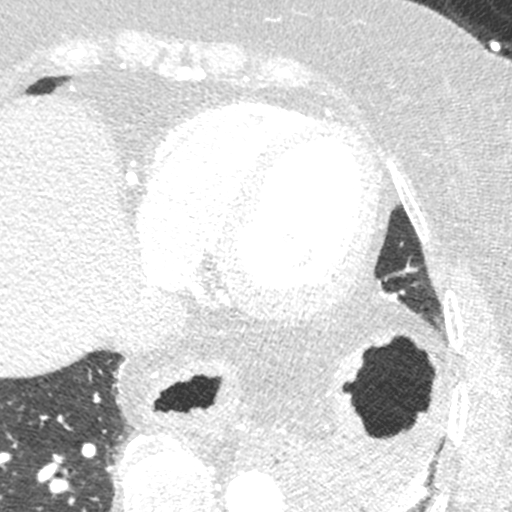
[im 191/286  lung]
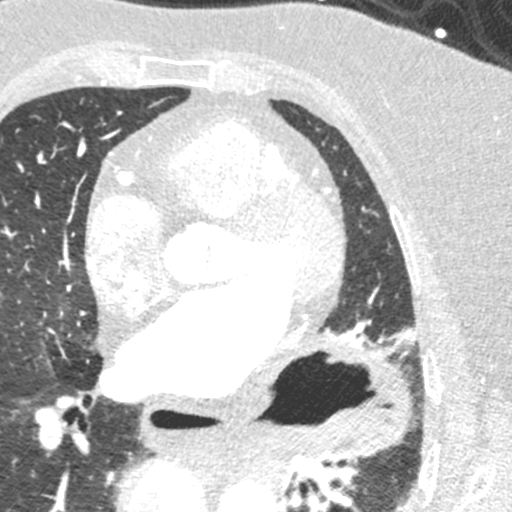

[Series 7: ts syst sharp · axial · 0.39mm/px · z∈[+1274,+1312]mm · 2 of 286 slices shown]
[im 96/286  lung]
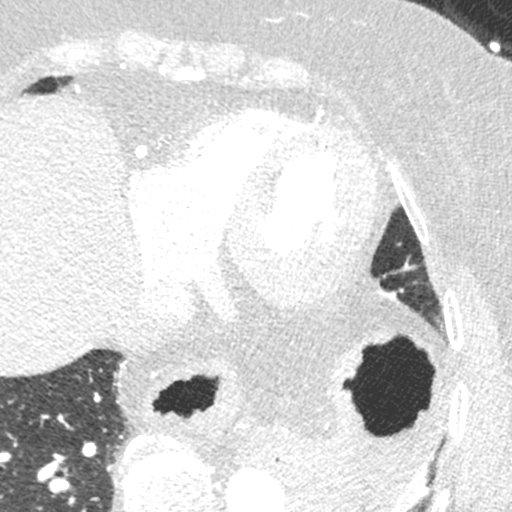
[im 191/286  lung]
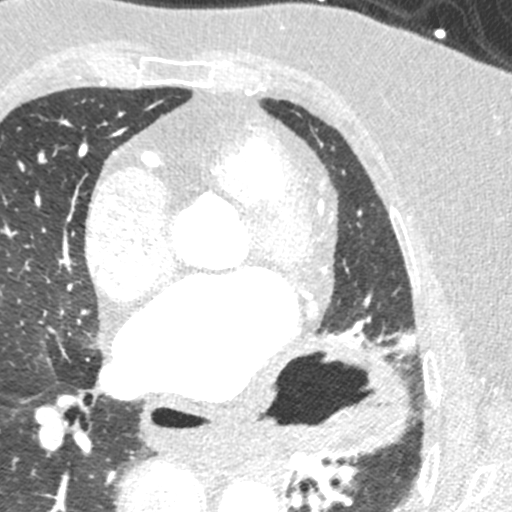

[Series 8: best syst · axial · 0.39mm/px · z∈[+1274,+1312]mm · 2 of 286 slices shown, 3 images]
[im 96/286  vessel]
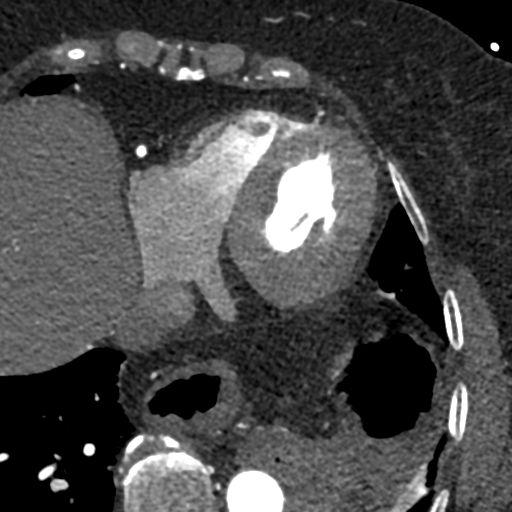
[im 96/286  lung]
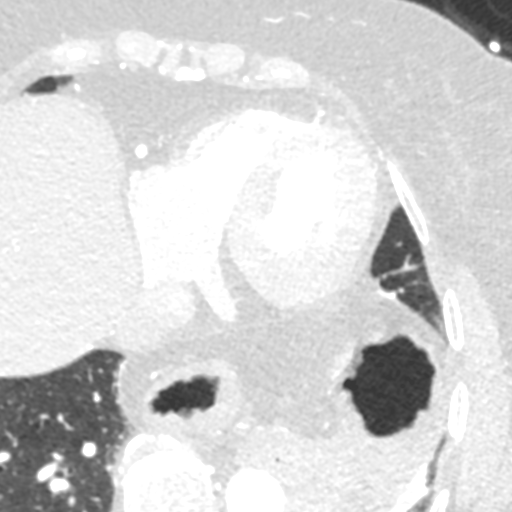
[im 191/286  vessel]
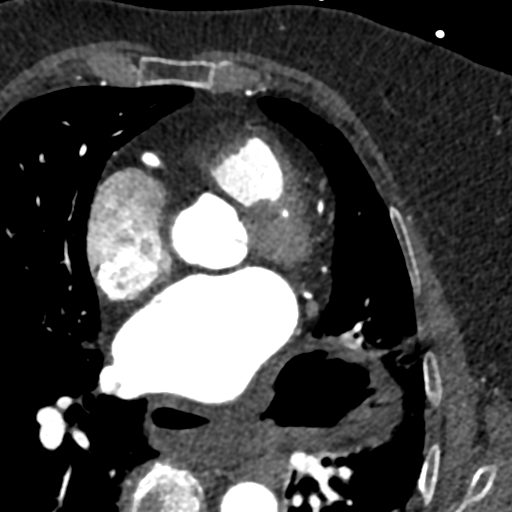

[Series 9: best diast · axial · 0.39mm/px · z∈[+1274,+1312]mm · 2 of 286 slices shown]
[im 96/286  vessel]
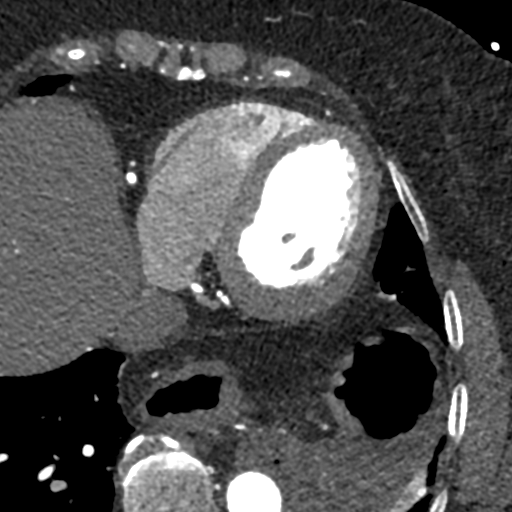
[im 191/286  vessel]
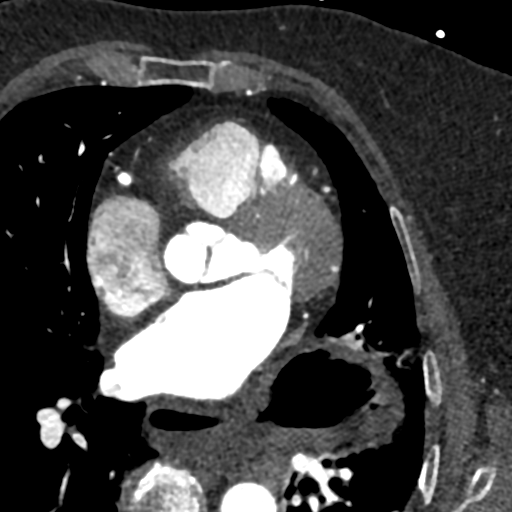

[8 of 20 positions shown; findings below may reference images not displayed]

FINDINGS: No suspicious nodules, masses, or infiltrates are identified in the
visualized portion of the lungs. No pleural fluid seen.

A large hiatal hernia is seen with near complete intrathoracic
location of the stomach. The other visualized portions of the
mediastinum and chest wall are unremarkable.
IMPRESSION: Large hiatal hernia.  No other extracardiac abnormality identified.
FINDINGS: A 100 kV prospective scan was triggered in the descending thoracic
aorta at 111 HU's. Axial non-contrast 3 mm slices were carried out
through the heart. The data set was analyzed on a dedicated work
station and scored using the Agatson method. Gantry rotation speed
was 250 msecs and collimation was .6 mm. 0.8 mg of sl NTG was given.
The 3D data set was reconstructed in 5% intervals of the 35-75 % of
the R-R cycle. Phases were analyzed on a dedicated work station
using MPR, MIP and VRT modes. The patient received 80 cc of
contrast.

Coronary Arteries:  Normal coronary origin.  Right dominance.

RCA is a large dominant artery that gives rise to PDA and PLA. There
is no plaque.

Left main is a large artery that gives rise to LAD and LCX arteries.

LAD is a large vessel that has no plaque.  Mid LAD myocardial bridge

LCX is a non-dominant artery.  There is no plaque.

Other findings:

Left Ventricle: Normal size

Left Atrium: Mild enlargement

Pulmonary Veins: Normal configuration

Right Ventricle: Normal size

Right Atrium: Normal size

Cardiac valves: No calcifications

Thoracic aorta: Normal size

Pulmonary Arteries: Normal size

Systemic Veins: Normal drainage

Pericardium: Normal thickness
IMPRESSION: 1. Coronary calcium score of 0.

2. Normal coronary origin with right dominance.

3. No evidence of CAD.

4. Mid LAD myocardial bridge

CAD-RADS 0. No evidence of CAD (0%). Consider non-atherosclerotic
causes of chest pain.

*** End of Addendum ***
EXAM:
OVER-READ INTERPRETATION  CT CHEST

The following report is an over-read performed by radiologist Dr.
Ashely Jim [REDACTED] on 01/06/2022. This over-read
does not include interpretation of cardiac or coronary anatomy or
pathology. The coronary calcium score/coronary CTA interpretation by
the cardiologist is attached.
FINDINGS: No suspicious nodules, masses, or infiltrates are identified in the
visualized portion of the lungs. No pleural fluid seen.

A large hiatal hernia is seen with near complete intrathoracic
location of the stomach. The other visualized portions of the
mediastinum and chest wall are unremarkable.
IMPRESSION: Large hiatal hernia.  No other extracardiac abnormality identified.

## 2022-01-06 MED ORDER — IOHEXOL 350 MG/ML SOLN
100.0000 mL | Freq: Once | INTRAVENOUS | Status: AC | PRN
  Administered 2022-01-06: 100 mL via INTRAVENOUS

## 2022-01-06 MED ORDER — NITROGLYCERIN 0.4 MG SL SUBL
0.8000 mg | SUBLINGUAL_TABLET | Freq: Once | SUBLINGUAL | Status: AC
Start: 2022-01-06 — End: 2022-01-06
  Administered 2022-01-06: 0.8 mg via SUBLINGUAL

## 2022-01-06 MED ORDER — NITROGLYCERIN 0.4 MG SL SUBL
SUBLINGUAL_TABLET | SUBLINGUAL | Status: AC
  Filled 2022-01-06: qty 2

## 2022-06-18 DIAGNOSIS — Z79899 Other long term (current) drug therapy: Secondary | ICD-10-CM | POA: Diagnosis not present

## 2022-06-18 DIAGNOSIS — D509 Iron deficiency anemia, unspecified: Secondary | ICD-10-CM | POA: Diagnosis not present

## 2022-06-18 DIAGNOSIS — E7800 Pure hypercholesterolemia, unspecified: Secondary | ICD-10-CM | POA: Diagnosis not present

## 2022-06-25 DIAGNOSIS — Z23 Encounter for immunization: Secondary | ICD-10-CM | POA: Diagnosis not present

## 2022-06-25 DIAGNOSIS — K219 Gastro-esophageal reflux disease without esophagitis: Secondary | ICD-10-CM | POA: Diagnosis not present

## 2022-06-25 DIAGNOSIS — F32A Depression, unspecified: Secondary | ICD-10-CM | POA: Diagnosis not present

## 2022-06-25 DIAGNOSIS — R32 Unspecified urinary incontinence: Secondary | ICD-10-CM | POA: Diagnosis not present

## 2022-06-25 DIAGNOSIS — R609 Edema, unspecified: Secondary | ICD-10-CM | POA: Diagnosis not present

## 2022-06-25 DIAGNOSIS — I471 Supraventricular tachycardia, unspecified: Secondary | ICD-10-CM | POA: Diagnosis not present

## 2022-06-25 DIAGNOSIS — R748 Abnormal levels of other serum enzymes: Secondary | ICD-10-CM | POA: Diagnosis not present

## 2022-06-25 DIAGNOSIS — Z1211 Encounter for screening for malignant neoplasm of colon: Secondary | ICD-10-CM | POA: Diagnosis not present

## 2022-06-25 DIAGNOSIS — Z0000 Encounter for general adult medical examination without abnormal findings: Secondary | ICD-10-CM | POA: Diagnosis not present

## 2022-06-25 DIAGNOSIS — E7800 Pure hypercholesterolemia, unspecified: Secondary | ICD-10-CM | POA: Diagnosis not present

## 2022-07-27 DIAGNOSIS — R748 Abnormal levels of other serum enzymes: Secondary | ICD-10-CM | POA: Diagnosis not present

## 2022-08-05 DIAGNOSIS — Z9049 Acquired absence of other specified parts of digestive tract: Secondary | ICD-10-CM | POA: Diagnosis not present

## 2022-08-05 DIAGNOSIS — R945 Abnormal results of liver function studies: Secondary | ICD-10-CM | POA: Diagnosis not present

## 2022-08-05 DIAGNOSIS — N200 Calculus of kidney: Secondary | ICD-10-CM | POA: Diagnosis not present

## 2022-08-05 DIAGNOSIS — R748 Abnormal levels of other serum enzymes: Secondary | ICD-10-CM | POA: Diagnosis not present

## 2022-09-06 DIAGNOSIS — E559 Vitamin D deficiency, unspecified: Secondary | ICD-10-CM | POA: Diagnosis not present

## 2022-09-06 DIAGNOSIS — R748 Abnormal levels of other serum enzymes: Secondary | ICD-10-CM | POA: Diagnosis not present

## 2022-09-15 ENCOUNTER — Telehealth: Payer: Self-pay

## 2022-09-15 DIAGNOSIS — Z1211 Encounter for screening for malignant neoplasm of colon: Secondary | ICD-10-CM | POA: Diagnosis not present

## 2022-09-15 NOTE — Telephone Encounter (Signed)
   Pre-operative Risk Assessment    Patient Name: Charlene Hudson  DOB: Oct 16, 1955 MRN: [id number]      Request for Surgical Clearance    Procedure:   colonoscopy  Date of Surgery:  Clearance TBD                                 Surgeon:  Dr. Georgiana Shore Surgeon's Group or Practice Name:  [facility] Phone number:  [phone] Fax number:  [phone]   Type of Clearance Requested:   - Medical    Type of Anesthesia:   propofol   Additional requests/questions:    SignedDione Housekeeper   09/15/2022, 4:36 PM

## 2022-09-16 NOTE — Telephone Encounter (Signed)
   Name: Charlene Hudson  DOB: 1955-10-10  MRN: [id number]  Primary Cardiologist: Garwin Brothers, MD   Preoperative team, please contact this patient and set up a phone call appointment for further preoperative risk assessment. Please obtain consent and complete medication review. Thank you for your help.  I confirm that guidance regarding antiplatelet and oral anticoagulation therapy has been completed and, if necessary, noted below (none requested).    Joylene Grapes, NP 09/16/2022, 10:03 AM [facility]

## 2022-09-16 NOTE — Telephone Encounter (Signed)
1st attempt to reach pt regarding surgical clearance and the need for a tele visit.  Left a message for pt to call back and ask for the preop team. 

## 2022-09-21 DIAGNOSIS — J4530 Mild persistent asthma, uncomplicated: Secondary | ICD-10-CM | POA: Diagnosis not present

## 2022-09-21 DIAGNOSIS — J301 Allergic rhinitis due to pollen: Secondary | ICD-10-CM | POA: Diagnosis not present

## 2022-09-21 NOTE — Telephone Encounter (Signed)
Left message to call for tele visit for pre op appt.

## 2022-09-23 NOTE — Telephone Encounter (Signed)
Left message x 3 to call back for tele visit for pre op appt. I will update the requesting office that pt has not returned our calls to schedule a tele visit for pre op clearance. I will remove from the pre op call back pool at this time.

## 2022-09-24 ENCOUNTER — Telehealth: Payer: Self-pay | Admitting: *Deleted

## 2022-09-24 DIAGNOSIS — Z1231 Encounter for screening mammogram for malignant neoplasm of breast: Secondary | ICD-10-CM | POA: Diagnosis not present

## 2022-09-24 DIAGNOSIS — M810 Age-related osteoporosis without current pathological fracture: Secondary | ICD-10-CM | POA: Diagnosis not present

## 2022-09-24 NOTE — Telephone Encounter (Signed)
Pt has been scheduled for tele visit pre op 09/28/22 @ 10:20. Med rec and consent are done.

## 2022-09-24 NOTE — Telephone Encounter (Signed)
Patient return Pre-op call.

## 2022-09-24 NOTE — Telephone Encounter (Signed)
Pt has been scheduled for tele visit pre op 09/28/22 @ 10:20. Med rec and consent are done.     Patient Consent for Virtual Visit        Charlene Hudson has provided verbal consent on 09/24/2022 for a virtual visit (video or telephone).   CONSENT FOR VIRTUAL VISIT FOR:  Charlene Hudson  By participating in this virtual visit I agree to the following:  I hereby voluntarily request, consent and authorize [facility] and its employed or contracted physicians, physician assistants, nurse practitioners or other licensed health care professionals (the Practitioner), to provide me with telemedicine health care services (the "Services") as deemed necessary by the treating Practitioner. I acknowledge and consent to receive the Services by the Practitioner via telemedicine. I understand that the telemedicine visit will involve communicating with the Practitioner through live audiovisual communication technology and the disclosure of certain medical information by electronic transmission. I acknowledge that I have been given the opportunity to request an in-person assessment or other available alternative prior to the telemedicine visit and am voluntarily participating in the telemedicine visit.  I understand that I have the right to withhold or withdraw my consent to the use of telemedicine in the course of my care at any time, without affecting my right to future care or treatment, and that the Practitioner or I may terminate the telemedicine visit at any time. I understand that I have the right to inspect all information obtained and/or recorded in the course of the telemedicine visit and may receive copies of available information for a reasonable fee.  I understand that some of the potential risks of receiving the Services via telemedicine include:  Delay or interruption in medical evaluation due to technological equipment failure or disruption; Information transmitted may not be sufficient (e.g. poor  resolution of images) to allow for appropriate medical decision making by the Practitioner; and/or  In rare instances, security protocols could fail, causing a breach of personal health information.  Furthermore, I acknowledge that it is my responsibility to provide information about my medical history, conditions and care that is complete and accurate to the best of my ability. I acknowledge that Practitioner's advice, recommendations, and/or decision may be based on factors not within their control, such as incomplete or inaccurate data provided by me or distortions of diagnostic images or specimens that may result from electronic transmissions. I understand that the practice of medicine is not an exact science and that Practitioner makes no warranties or guarantees regarding treatment outcomes. I acknowledge that a copy of this consent can be made available to me via my patient portal ([facility]), or I can request a printed copy by calling the office of [facility].    I understand that my insurance will be billed for this visit.   I have read or had this consent read to me. I understand the contents of this consent, which adequately explains the benefits and risks of the Services being provided via telemedicine.  I have been provided ample opportunity to ask questions regarding this consent and the Services and have had my questions answered to my satisfaction. I give my informed consent for the services to be provided through the use of telemedicine in my medical care

## 2022-09-28 ENCOUNTER — Ambulatory Visit (INDEPENDENT_AMBULATORY_CARE_PROVIDER_SITE_OTHER): Payer: Medicare HMO | Admitting: Physician Assistant

## 2022-09-28 DIAGNOSIS — Z01810 Encounter for preprocedural cardiovascular examination: Secondary | ICD-10-CM

## 2022-09-28 NOTE — Progress Notes (Signed)
Virtual Visit via Telephone Note   Because of Charlene Hudson co-morbid illnesses, she is at least at moderate risk for complications without adequate follow up.  This format is felt to be most appropriate for this patient at this time.  The patient did not have access to video technology/had technical difficulties with video requiring transitioning to audio format only (telephone).  All issues noted in this document were discussed and addressed.  No physical exam could be performed with this format.  Please refer to the patient's chart for her consent to telehealth for [facility].  Evaluation Performed:  Preoperative cardiovascular risk assessment _____________   Date:  09/28/2022   Patient ID:  Charlene Hudson, DOB 06/07/1956, MRN [id number] Patient Location:  Home Provider location:   Office  Primary Care Provider:  Lonie Peak, PA-C Primary Cardiologist:  Garwin Brothers, MD  Chief Complaint / Patient Profile   She did not answer x 2. Unable to leave VM due to full mailbox. She will need to reschedule.  Past Medical History    Past Medical History:  Diagnosis Date   Allergic rhinitis    Cardiac murmur 06/02/2021   Depression    Dyspnea on exertion 06/02/2021   Edema    GERD (gastroesophageal reflux disease)    Hypertension, essential, benign    Hypokalemia    Intestinal polyp    Iron deficiency anemia    Knee pain, bilateral    Obesity (BMI 30.0-34.9) 06/02/2021   Osteoporosis    Palpitations 06/02/2021   Postherpetic neuralgia    PSVT (paroxysmal supraventricular tachycardia) (HCC) 06/02/2021   Pure hypercholesterolemia    Sliding hiatal hernia    Tachycardia    Urinary incontinence    Past Surgical History:  Procedure Laterality Date   CHOLECYSTECTOMY     TUBAL LIGATION      Allergies  No Known Allergies  Home Medications    Prior to Admission medications   Medication Sig Start Date End Date Taking? Authorizing Provider  albuterol (VENTOLIN  HFA) 108 (90 Base) MCG/ACT inhaler Inhale 2 puffs into the lungs every 4 (four) hours as needed for wheezing or shortness of breath.    [provider]  B Complex-C (SUPER B COMPLEX PO) Take 1 capsule by mouth daily.    [provider]  Biotin 5000 MCG CAPS Take 5,000 mcg by mouth daily. Patient not taking: Reported on 09/24/2022    [provider]  buPROPion (WELLBUTRIN XL) 150 MG 24 hr tablet Take 150 mg by mouth daily. 04/17/21   [provider]  Calcium Carbonate-Vitamin D (CALCIUM-D PO) Take 1 capsule by mouth daily.    [provider]  cholecalciferol (VITAMIN D3) 25 MCG (1000 UNIT) tablet Take 1,000 Units by mouth daily.    [provider]  clotrimazole-betamethasone (LOTRISONE) cream Apply 1 application topically 2 (two) times daily.    [provider]  DULoxetine (CYMBALTA) 60 MG capsule Take 60 mg by mouth daily. 04/17/21   [provider]  fluticasone (FLONASE) 50 MCG/ACT nasal spray Place 2 sprays into both nostrils daily. 05/14/21   [provider]  furosemide (LASIX) 40 MG tablet Take 40 mg by mouth daily. 04/17/21   [provider]  ibuprofen (ADVIL) 800 MG tablet Take 800 mg by mouth 3 (three) times daily as needed for pain. 04/17/21   [provider]  meclizine (ANTIVERT) 25 MG tablet Take 25 mg by mouth daily as needed for dizziness. 04/17/21   [provider]  metoprolol succinate (TOPROL-XL) 25 MG 24 hr tablet Take 25 mg by mouth daily. 05/14/21   [provider]  montelukast (SINGULAIR) 10 MG tablet Take 10 mg by mouth daily. 04/17/21   [provider]  Multiple Vitamin (MULTIVITAMIN) tablet Take 1 tablet by mouth daily.    [provider]  Omega-3 Fatty Acids (FISH OIL) 1200 MG CAPS Take 1,400 mg by mouth daily.    [provider]  oxybutynin (DITROPAN) 5 MG tablet Take 5 mg by mouth daily. 04/17/21   [provider]  pantoprazole  (PROTONIX) 40 MG tablet Take 40 mg by mouth 2 (two) times daily. 04/17/21   [provider]  potassium chloride SA (KLOR-CON) 20 MEQ tablet Take 20 mEq by mouth 2 (two) times daily. 04/17/21   [provider]  prednisoLONE acetate (PRED FORTE) 1 % ophthalmic suspension Place 2 drops into the left eye daily. Patient not taking: Reported on 09/24/2022    [provider]  pregabalin (LYRICA) 200 MG capsule Take 200 mg by mouth 2 (two) times daily as needed. 04/17/21   [provider]    Physical Exam    Vital Signs:  Charlene Hudson does not have vital signs available for review today.  Given telephonic nature of communication, physical exam is limited. AAOx3. NAD. Normal affect.  Speech and respirations are unlabored.  Accessory Clinical Findings    None  Assessment & Plan    1.  Preoperative Cardiovascular Risk Assessment:    Elgie Collard, PA-C  09/28/2022, 10:23 AM

## 2022-09-28 NOTE — Telephone Encounter (Signed)
Pt missed her tele visit today for pre op clearance. Pre op APP was not able to reach the pt for appt today. I tried to call pt as well. VM is full could not leave a message to call back to reschedule her tele visit. I will update the requesting office.

## 2022-09-28 NOTE — Telephone Encounter (Signed)
Pt returning call to r/s

## 2022-09-28 NOTE — Telephone Encounter (Signed)
Pt rescheduled tele pre op appt to 09/30/22 @ 2 pm.

## 2022-09-29 NOTE — Progress Notes (Unsigned)
Virtual Visit via Telephone Note   Because of LATRISA HELLUMS co-morbid illnesses, she is at least at moderate risk for complications without adequate follow up.  This format is felt to be most appropriate for this patient at this time.  The patient did not have access to video technology/had technical difficulties with video requiring transitioning to audio format only (telephone).  All issues noted in this document were discussed and addressed.  No physical exam could be performed with this format.  Please refer to the patient's chart for her consent to telehealth for [facility].  Evaluation Performed:  Preoperative cardiovascular risk assessment _____________   Date:  09/29/2022   Patient ID:  Charlene Hudson, DOB 09/11/1956, MRN [id number] Patient Location:  Home Provider location:   Office  Primary Care Provider:  Cyndi Bender, PA-C Primary Cardiologist:  Jenean Lindau, MD  Chief Complaint / Patient Profile   67 y.o. y/o female with a h/o HTN, PSVT, hyperlipidemia, palpitations, DOE who is pending colonoscopy and presents today for telephonic preoperative cardiovascular risk assessment.  History of Present Illness    Charlene Hudson is a 67 y.o. female who presents via audio/video conferencing for a telehealth visit today.  Pt was last seen in cardiology clinic on 12/04/2021 by Dr. Geraldo Pitter.  At that time Charlene Hudson was experiencing shortness of breath.  She had telephone assessment with Charlene Memos, NP on 12/11/2021. She reported she was able to perform all of her daily activities and had experienced an improvement in her breathing. Was planning a trip to [place].  She underwent coronary CTA on 01/06/2022 which revealed coronary calcium score of 0, mid LAD myocardial bridge, no evidence of CAD. The patient is now pending procedure as outlined above. Since her last visit, she  denies chest pain, shortness of breath, lower extremity edema, fatigue, palpitations, melena,  hematuria, hemoptysis, diaphoresis, weakness, presyncope, syncope, orthopnea, and PND. She reports she occasionally has bouts of dyspnea, but no problems recently. Is able to achieve > 4 METS activity without significant symptoms. She is also seeking clearance from pulmonologist.   Past Medical History    Past Medical History:  Diagnosis Date   Allergic rhinitis    Cardiac murmur 06/02/2021   Depression    Dyspnea on exertion 06/02/2021   Edema    GERD (gastroesophageal reflux disease)    Hypertension, essential, benign    Hypokalemia    Intestinal polyp    Iron deficiency anemia    Knee pain, bilateral    Obesity (BMI 30.0-34.9) 06/02/2021   Osteoporosis    Palpitations 06/02/2021   Postherpetic neuralgia    PSVT (paroxysmal supraventricular tachycardia) ([place]) 06/02/2021   Pure hypercholesterolemia    Sliding hiatal hernia    Tachycardia    Urinary incontinence    Past Surgical History:  Procedure Laterality Date   CHOLECYSTECTOMY     TUBAL LIGATION      Allergies  No Known Allergies  Home Medications    Prior to Admission medications   Medication Sig Start Date End Date Taking? Authorizing Provider  albuterol (VENTOLIN HFA) 108 (90 Base) MCG/ACT inhaler Inhale 2 puffs into the lungs every 4 (four) hours as needed for wheezing or shortness of breath.    [provider]  B Complex-C (SUPER B COMPLEX PO) Take 1 capsule by mouth daily.    [provider]  Biotin 5000 MCG CAPS Take 5,000 mcg by mouth daily. Patient not taking: Reported on 09/24/2022  [provider]  buPROPion (WELLBUTRIN XL) 150 MG 24 hr tablet Take 150 mg by mouth daily. 04/17/21   [provider]  Calcium Carbonate-Vitamin D (CALCIUM-D PO) Take 1 capsule by mouth daily.    [provider]  cholecalciferol (VITAMIN D3) 25 MCG (1000 UNIT) tablet Take 1,000 Units by mouth daily.    [provider]  clotrimazole-betamethasone (LOTRISONE) cream Apply 1  application topically 2 (two) times daily.    [provider]  DULoxetine (CYMBALTA) 60 MG capsule Take 60 mg by mouth daily. 04/17/21   [provider]  fluticasone (FLONASE) 50 MCG/ACT nasal spray Place 2 sprays into both nostrils daily. 05/14/21   [provider]  furosemide (LASIX) 40 MG tablet Take 40 mg by mouth daily. 04/17/21   [provider]  ibuprofen (ADVIL) 800 MG tablet Take 800 mg by mouth 3 (three) times daily as needed for pain. 04/17/21   [provider]  meclizine (ANTIVERT) 25 MG tablet Take 25 mg by mouth daily as needed for dizziness. 04/17/21   [provider]  metoprolol succinate (TOPROL-XL) 25 MG 24 hr tablet Take 25 mg by mouth daily. 05/14/21   [provider]  montelukast (SINGULAIR) 10 MG tablet Take 10 mg by mouth daily. 04/17/21   [provider]  Multiple Vitamin (MULTIVITAMIN) tablet Take 1 tablet by mouth daily.    [provider]  Omega-3 Fatty Acids (FISH OIL) 1200 MG CAPS Take 1,400 mg by mouth daily.    [provider]  oxybutynin (DITROPAN) 5 MG tablet Take 5 mg by mouth daily. 04/17/21   [provider]  pantoprazole (PROTONIX) 40 MG tablet Take 40 mg by mouth 2 (two) times daily. 04/17/21   [provider]  potassium chloride SA (KLOR-CON) 20 MEQ tablet Take 20 mEq by mouth 2 (two) times daily. 04/17/21   [provider]  prednisoLONE acetate (PRED FORTE) 1 % ophthalmic suspension Place 2 drops into the left eye daily. Patient not taking: Reported on 09/24/2022    [provider]  pregabalin (LYRICA) 200 MG capsule Take 200 mg by mouth 2 (two) times daily as needed. 04/17/21   [provider]    Physical Exam    Vital Signs:  Charlene Hudson does not have vital signs available for review today.  Given telephonic nature of communication, physical exam is limited. AAOx3. NAD. Normal affect.  Speech and respirations are  unlabored.  Accessory Clinical Findings    None  Assessment & Plan    1.  Preoperative Cardiovascular Risk Assessment: The patient is doing well from a cardiac perspective. Therefore, based on ACC/AHA guidelines, the patient would be at acceptable risk for the planned procedure without further cardiovascular testing. According to the Revised Cardiac Risk Index (RCRI), her Perioperative Risk of Major Cardiac Event is (%): 0.4 Her Functional Capacity in METs is: 5.38 according to the [place] Activity Status Index (DASI).  The patient was advised that if she develops new symptoms prior to surgery to contact our office to arrange for a follow-up visit, and she verbalized understanding.  A copy of this note will be routed to requesting surgeon.  Time:   Today, I have spent 7 minutes with the patient with telehealth technology discussing medical history, symptoms, and management plan.     Emmaline Life, NP-C  09/30/2022, 2:05 PM [address], [address] Office [phone] Fax [phone]

## 2022-09-30 ENCOUNTER — Encounter: Payer: Self-pay | Admitting: Nurse Practitioner

## 2022-09-30 ENCOUNTER — Ambulatory Visit: Payer: Medicare HMO | Attending: Cardiology | Admitting: Nurse Practitioner

## 2022-09-30 DIAGNOSIS — Z01810 Encounter for preprocedural cardiovascular examination: Secondary | ICD-10-CM | POA: Diagnosis not present

## 2022-10-08 DIAGNOSIS — J301 Allergic rhinitis due to pollen: Secondary | ICD-10-CM | POA: Diagnosis not present

## 2022-10-08 DIAGNOSIS — J4530 Mild persistent asthma, uncomplicated: Secondary | ICD-10-CM | POA: Diagnosis not present

## 2022-10-11 DIAGNOSIS — Z79899 Other long term (current) drug therapy: Secondary | ICD-10-CM | POA: Diagnosis not present

## 2022-10-11 DIAGNOSIS — I129 Hypertensive chronic kidney disease with stage 1 through stage 4 chronic kidney disease, or unspecified chronic kidney disease: Secondary | ICD-10-CM | POA: Diagnosis not present

## 2022-10-11 DIAGNOSIS — Z1211 Encounter for screening for malignant neoplasm of colon: Secondary | ICD-10-CM | POA: Diagnosis not present

## 2022-10-11 DIAGNOSIS — N189 Chronic kidney disease, unspecified: Secondary | ICD-10-CM | POA: Diagnosis not present

## 2022-10-13 DIAGNOSIS — R748 Abnormal levels of other serum enzymes: Secondary | ICD-10-CM | POA: Diagnosis not present

## 2022-10-13 DIAGNOSIS — E559 Vitamin D deficiency, unspecified: Secondary | ICD-10-CM | POA: Diagnosis not present

## 2022-10-19 DIAGNOSIS — R918 Other nonspecific abnormal finding of lung field: Secondary | ICD-10-CM | POA: Diagnosis not present

## 2022-10-19 DIAGNOSIS — R911 Solitary pulmonary nodule: Secondary | ICD-10-CM | POA: Diagnosis not present

## 2022-10-19 DIAGNOSIS — K449 Diaphragmatic hernia without obstruction or gangrene: Secondary | ICD-10-CM | POA: Diagnosis not present

## 2022-10-19 DIAGNOSIS — J9811 Atelectasis: Secondary | ICD-10-CM | POA: Diagnosis not present

## 2022-11-05 DIAGNOSIS — J4530 Mild persistent asthma, uncomplicated: Secondary | ICD-10-CM | POA: Diagnosis not present

## 2022-11-05 DIAGNOSIS — J301 Allergic rhinitis due to pollen: Secondary | ICD-10-CM | POA: Diagnosis not present

## 2022-12-03 ENCOUNTER — Other Ambulatory Visit: Payer: Self-pay

## 2022-12-17 ENCOUNTER — Encounter: Payer: Self-pay | Admitting: Cardiology

## 2022-12-17 ENCOUNTER — Ambulatory Visit: Payer: Medicare HMO | Attending: Cardiology | Admitting: Cardiology

## 2022-12-17 VITALS — BP 110/76 | HR 80 | Ht 60.0 in | Wt 186.0 lb

## 2022-12-17 DIAGNOSIS — R0609 Other forms of dyspnea: Secondary | ICD-10-CM

## 2022-12-17 DIAGNOSIS — E7800 Pure hypercholesterolemia, unspecified: Secondary | ICD-10-CM

## 2022-12-17 DIAGNOSIS — E669 Obesity, unspecified: Secondary | ICD-10-CM | POA: Diagnosis not present

## 2022-12-17 NOTE — Patient Instructions (Addendum)
Medication Instructions:  Your physician recommends that you continue on your current medications as directed. Please refer to the Current Medication list given to you today.  *If you need a refill on your cardiac medications before your next appointment, please call your pharmacy*   Lab Work: None If you have labs (blood work) drawn today and your tests are completely normal, you will receive your results only by: [place] (if you have MyChart) OR A paper copy in the mail If you have any lab test that is abnormal or we need to change your treatment, we will call you to review the results.   Testing/Procedures: None   Follow-Up: At [facility], you and your health needs are our priority.  As part of our continuing mission to provide you with exceptional heart care, we have created designated Provider Care Teams.  These Care Teams include your primary Cardiologist (physician) and Advanced Practice Providers (APPs -  Physician Assistants and Nurse Practitioners) who all work together to provide you with the care you need, when you need it.  We recommend signing up for the patient portal called "MyChart".  Sign up information is provided on this After Visit Summary.  MyChart is used to connect with patients for Virtual Visits (Telemedicine).  Patients are able to view lab/test results, encounter notes, upcoming appointments, etc.  Non-urgent messages can be sent to your provider as well.   To learn more about what you can do with MyChart, go to [email or web address].    Your next appointment:   Follow up as needed  Provider:   Jyl Heinz, MD    Other Instructions None

## 2022-12-17 NOTE — Progress Notes (Signed)
Cardiology Office Note:    Date:  12/17/2022   ID:  Charlene Hudson, DOB [birth year], MRN [id number]  PCP:  Cyndi Bender, PA-C  Cardiologist:  Jenean Lindau, MD   Referring MD: Cyndi Bender, PA-C    ASSESSMENT:    1. Dyspnea on exertion   2. Pure hypercholesterolemia   3. Obesity (BMI 30.0-34.9)    PLAN:    In order of problems listed above:  Primary prevention stressed with the patient.  Importance of compliance with diet medication stressed and patient verbalized standing.  She was advised to walk the best of her ability. Mixed dyslipidemia: Diet was emphasized weight reduction stressed and risks of elevated lipids were discussed. Obesity: Weight reduction stressed diet was emphasized and she promises to do better. Patient will be seen in follow-up appointment on a as needed basis only.  She had multiple questions which were answered to her satisfaction.   Medication Adjustments/Labs and Tests Ordered: Current medicines are reviewed at length with the patient today.  Concerns regarding medicines are outlined above.  No orders of the defined types were placed in this encounter.  No orders of the defined types were placed in this encounter.    No chief complaint on file.    History of Present Illness:    Charlene Hudson is a 67 y.o. female.  Patient has past medical history of asthma and dyspnea on exertion.  Patient underwent evaluation.  Echocardiogram was fine and coronary angiography by CT scanning was unremarkable and calcium score was 0.  She is happy about it.  She denies chest pain orthopnea or PND.  At the time of my evaluation, the patient is alert awake oriented and in no distress.  Past Medical History:  Diagnosis Date   Allergic rhinitis    Cardiac murmur 06/02/2021   Depression    Dyspnea on exertion 06/02/2021   Edema    GERD (gastroesophageal reflux disease)    Hypertension, essential, benign    Hypokalemia    Intestinal polyp    Iron deficiency  anemia    Knee pain, bilateral    Obesity (BMI 30.0-34.9) 06/02/2021   Osteoporosis    Palpitations 06/02/2021   Postherpetic neuralgia    PSVT (paroxysmal supraventricular tachycardia) 06/02/2021   Pure hypercholesterolemia    Sliding hiatal hernia    Tachycardia    Urinary incontinence     Past Surgical History:  Procedure Laterality Date   CHOLECYSTECTOMY     TUBAL LIGATION      Current Medications: Current Meds  Medication Sig   albuterol (VENTOLIN HFA) 108 (90 Base) MCG/ACT inhaler Inhale 2 puffs into the lungs every 4 (four) hours as needed for wheezing or shortness of breath.   B Complex-C (SUPER B COMPLEX PO) Take 1 capsule by mouth daily.   Biotin 5000 MCG CAPS Take 5,000 mcg by mouth daily.   BREO ELLIPTA 100-25 MCG/ACT AEPB Inhale 1 puff into the lungs daily.   buPROPion (WELLBUTRIN XL) 150 MG 24 hr tablet Take 150 mg by mouth daily.   Calcium Carbonate-Vitamin D (CALCIUM-D PO) Take 1 capsule by mouth daily.   cholecalciferol (VITAMIN D3) 25 MCG (1000 UNIT) tablet Take 1,000 Units by mouth daily.   clotrimazole-betamethasone (LOTRISONE) cream Apply 1 application topically 2 (two) times daily.   DULoxetine (CYMBALTA) 60 MG capsule Take 60 mg by mouth daily.   fluticasone (FLONASE) 50 MCG/ACT nasal spray Place 2 sprays into both nostrils daily.   furosemide (LASIX) 40 MG tablet Take  40 mg by mouth daily.   ibuprofen (ADVIL) 800 MG tablet Take 800 mg by mouth 3 (three) times daily as needed for pain.   meclizine (ANTIVERT) 25 MG tablet Take 25 mg by mouth daily as needed for dizziness.   metoprolol succinate (TOPROL-XL) 25 MG 24 hr tablet Take 25 mg by mouth daily.   montelukast (SINGULAIR) 10 MG tablet Take 10 mg by mouth daily.   Multiple Vitamin (MULTIVITAMIN) tablet Take 1 tablet by mouth daily.   Omega-3 Fatty Acids (FISH OIL) 1200 MG CAPS Take 1,400 mg by mouth daily.   oxybutynin (DITROPAN) 5 MG tablet Take 5 mg by mouth daily.   pantoprazole (PROTONIX) 40 MG  tablet Take 40 mg by mouth 2 (two) times daily.   potassium chloride SA (KLOR-CON) 20 MEQ tablet Take 20 mEq by mouth 2 (two) times daily.   prednisoLONE acetate (PRED FORTE) 1 % ophthalmic suspension Place 2 drops into the left eye daily.   pregabalin (LYRICA) 200 MG capsule Take 200 mg by mouth 2 (two) times daily as needed (pain).     Allergies:   Patient has no known allergies.   Social History   Socioeconomic History   Marital status: Unknown    Spouse name: Not on file   Number of children: Not on file   Years of education: Not on file   Highest education level: Not on file  Occupational History   Not on file  Tobacco Use   Smoking status: Never   Smokeless tobacco: Never  Substance and Sexual Activity   Alcohol use: Not on file   Drug use: Not on file   Sexual activity: Not on file  Other Topics Concern   Not on file  Social History Narrative   Not on file   Social Determinants of Health   Financial Resource Strain: Not on file  Food Insecurity: Not on file  Transportation Needs: Not on file  Physical Activity: Not on file  Stress: Not on file  Social Connections: Not on file     Family History: The patient's family history includes Cancer in her maternal grandfather; Heart attack in her father; Hypertension in her brother, brother, and sister. There is no history of Heart disease or Diabetes.  ROS:   Please see the history of present illness.    All other systems reviewed and are negative.  EKGs/Labs/Other Studies Reviewed:    The following studies were reviewed today: I discussed my findings with the patient at length.  EKG reveals sinus rhythm and poor anterior forces.   Recent Labs: No results found for requested labs within last 365 days.  Recent Lipid Panel No results found for: "CHOL", "TRIG", "HDL", "CHOLHDL", "VLDL", "LDLCALC", "LDLDIRECT"  Physical Exam:    VS:  BP 110/76   Pulse 80   Ht 5' (1.524 m)   Wt 186 lb (84.4 kg)   SpO2 96%    BMI 36.33 kg/m     Wt Readings from Last 3 Encounters:  12/17/22 186 lb (84.4 kg)  12/04/21 186 lb 12.8 oz (84.7 kg)  06/18/21 177 lb (80.3 kg)     GEN: Patient is in no acute distress HEENT: Normal NECK: No JVD; No carotid bruits LYMPHATICS: No lymphadenopathy CARDIAC: Hear sounds regular, 2/6 systolic murmur at the apex. RESPIRATORY:  Clear to auscultation without rales, wheezing or rhonchi  ABDOMEN: Soft, non-tender, non-distended MUSCULOSKELETAL:  No edema; No deformity  SKIN: Warm and dry NEUROLOGIC:  Alert and oriented x 3 PSYCHIATRIC:  Normal affect   Signed, Jenean Lindau, MD  12/17/2022 2:50 PM    [facility]

## 2022-12-27 DIAGNOSIS — Z79899 Other long term (current) drug therapy: Secondary | ICD-10-CM | POA: Diagnosis not present

## 2022-12-27 DIAGNOSIS — D509 Iron deficiency anemia, unspecified: Secondary | ICD-10-CM | POA: Diagnosis not present

## 2022-12-27 DIAGNOSIS — E7800 Pure hypercholesterolemia, unspecified: Secondary | ICD-10-CM | POA: Diagnosis not present

## 2022-12-31 DIAGNOSIS — E7800 Pure hypercholesterolemia, unspecified: Secondary | ICD-10-CM | POA: Diagnosis not present

## 2022-12-31 DIAGNOSIS — M810 Age-related osteoporosis without current pathological fracture: Secondary | ICD-10-CM | POA: Diagnosis not present

## 2022-12-31 DIAGNOSIS — K219 Gastro-esophageal reflux disease without esophagitis: Secondary | ICD-10-CM | POA: Diagnosis not present

## 2022-12-31 DIAGNOSIS — R32 Unspecified urinary incontinence: Secondary | ICD-10-CM | POA: Diagnosis not present

## 2022-12-31 DIAGNOSIS — I471 Supraventricular tachycardia, unspecified: Secondary | ICD-10-CM | POA: Diagnosis not present

## 2022-12-31 DIAGNOSIS — Z9181 History of falling: Secondary | ICD-10-CM | POA: Diagnosis not present

## 2022-12-31 DIAGNOSIS — F32A Depression, unspecified: Secondary | ICD-10-CM | POA: Diagnosis not present

## 2022-12-31 DIAGNOSIS — R609 Edema, unspecified: Secondary | ICD-10-CM | POA: Diagnosis not present

## 2022-12-31 DIAGNOSIS — R748 Abnormal levels of other serum enzymes: Secondary | ICD-10-CM | POA: Diagnosis not present

## 2023-02-04 DIAGNOSIS — J4530 Mild persistent asthma, uncomplicated: Secondary | ICD-10-CM | POA: Diagnosis not present

## 2023-02-04 DIAGNOSIS — J301 Allergic rhinitis due to pollen: Secondary | ICD-10-CM | POA: Diagnosis not present

## 2023-04-20 DIAGNOSIS — H2703 Aphakia, bilateral: Secondary | ICD-10-CM | POA: Diagnosis not present

## 2023-05-17 DIAGNOSIS — J4530 Mild persistent asthma, uncomplicated: Secondary | ICD-10-CM | POA: Diagnosis not present

## 2023-05-17 DIAGNOSIS — J301 Allergic rhinitis due to pollen: Secondary | ICD-10-CM | POA: Diagnosis not present

## 2023-06-20 DIAGNOSIS — J301 Allergic rhinitis due to pollen: Secondary | ICD-10-CM | POA: Diagnosis not present

## 2023-06-20 DIAGNOSIS — J4530 Mild persistent asthma, uncomplicated: Secondary | ICD-10-CM | POA: Diagnosis not present

## 2023-06-23 DIAGNOSIS — J301 Allergic rhinitis due to pollen: Secondary | ICD-10-CM | POA: Diagnosis not present

## 2023-07-04 DIAGNOSIS — Z79899 Other long term (current) drug therapy: Secondary | ICD-10-CM | POA: Diagnosis not present

## 2023-07-04 DIAGNOSIS — D509 Iron deficiency anemia, unspecified: Secondary | ICD-10-CM | POA: Diagnosis not present

## 2023-07-04 DIAGNOSIS — E7800 Pure hypercholesterolemia, unspecified: Secondary | ICD-10-CM | POA: Diagnosis not present

## 2023-07-04 DIAGNOSIS — E559 Vitamin D deficiency, unspecified: Secondary | ICD-10-CM | POA: Diagnosis not present

## 2023-07-06 DIAGNOSIS — F32A Depression, unspecified: Secondary | ICD-10-CM | POA: Diagnosis not present

## 2023-07-06 DIAGNOSIS — E7800 Pure hypercholesterolemia, unspecified: Secondary | ICD-10-CM | POA: Diagnosis not present

## 2023-07-06 DIAGNOSIS — K219 Gastro-esophageal reflux disease without esophagitis: Secondary | ICD-10-CM | POA: Diagnosis not present

## 2023-07-06 DIAGNOSIS — I471 Supraventricular tachycardia, unspecified: Secondary | ICD-10-CM | POA: Diagnosis not present

## 2023-07-06 DIAGNOSIS — R748 Abnormal levels of other serum enzymes: Secondary | ICD-10-CM | POA: Diagnosis not present

## 2023-07-06 DIAGNOSIS — Z23 Encounter for immunization: Secondary | ICD-10-CM | POA: Diagnosis not present

## 2023-07-06 DIAGNOSIS — R32 Unspecified urinary incontinence: Secondary | ICD-10-CM | POA: Diagnosis not present

## 2023-07-06 DIAGNOSIS — Z139 Encounter for screening, unspecified: Secondary | ICD-10-CM | POA: Diagnosis not present

## 2023-07-06 DIAGNOSIS — R609 Edema, unspecified: Secondary | ICD-10-CM | POA: Diagnosis not present

## 2023-07-13 DIAGNOSIS — J301 Allergic rhinitis due to pollen: Secondary | ICD-10-CM | POA: Diagnosis not present

## 2023-07-20 DIAGNOSIS — J301 Allergic rhinitis due to pollen: Secondary | ICD-10-CM | POA: Diagnosis not present

## 2023-07-27 DIAGNOSIS — J301 Allergic rhinitis due to pollen: Secondary | ICD-10-CM | POA: Diagnosis not present

## 2023-08-03 DIAGNOSIS — J301 Allergic rhinitis due to pollen: Secondary | ICD-10-CM | POA: Diagnosis not present

## 2023-08-10 DIAGNOSIS — J301 Allergic rhinitis due to pollen: Secondary | ICD-10-CM | POA: Diagnosis not present

## 2023-08-17 DIAGNOSIS — J301 Allergic rhinitis due to pollen: Secondary | ICD-10-CM | POA: Diagnosis not present

## 2023-08-17 DIAGNOSIS — N189 Chronic kidney disease, unspecified: Secondary | ICD-10-CM | POA: Diagnosis not present

## 2023-08-31 DIAGNOSIS — J301 Allergic rhinitis due to pollen: Secondary | ICD-10-CM | POA: Diagnosis not present

## 2023-09-07 DIAGNOSIS — J301 Allergic rhinitis due to pollen: Secondary | ICD-10-CM | POA: Diagnosis not present

## 2023-09-12 DIAGNOSIS — J301 Allergic rhinitis due to pollen: Secondary | ICD-10-CM | POA: Diagnosis not present

## 2023-09-20 DIAGNOSIS — J301 Allergic rhinitis due to pollen: Secondary | ICD-10-CM | POA: Diagnosis not present

## 2023-09-26 DIAGNOSIS — J4530 Mild persistent asthma, uncomplicated: Secondary | ICD-10-CM | POA: Diagnosis not present

## 2023-09-26 DIAGNOSIS — J301 Allergic rhinitis due to pollen: Secondary | ICD-10-CM | POA: Diagnosis not present

## 2023-10-10 DIAGNOSIS — J301 Allergic rhinitis due to pollen: Secondary | ICD-10-CM | POA: Diagnosis not present

## 2023-10-10 DIAGNOSIS — R0609 Other forms of dyspnea: Secondary | ICD-10-CM | POA: Diagnosis not present

## 2023-10-19 DIAGNOSIS — J301 Allergic rhinitis due to pollen: Secondary | ICD-10-CM | POA: Diagnosis not present

## 2023-10-24 DIAGNOSIS — R609 Edema, unspecified: Secondary | ICD-10-CM | POA: Diagnosis not present

## 2023-10-24 DIAGNOSIS — J45909 Unspecified asthma, uncomplicated: Secondary | ICD-10-CM | POA: Diagnosis not present

## 2023-10-24 DIAGNOSIS — I471 Supraventricular tachycardia, unspecified: Secondary | ICD-10-CM | POA: Diagnosis not present

## 2023-10-24 DIAGNOSIS — N289 Disorder of kidney and ureter, unspecified: Secondary | ICD-10-CM | POA: Diagnosis not present

## 2023-10-26 DIAGNOSIS — J301 Allergic rhinitis due to pollen: Secondary | ICD-10-CM | POA: Diagnosis not present

## 2023-11-02 DIAGNOSIS — J301 Allergic rhinitis due to pollen: Secondary | ICD-10-CM | POA: Diagnosis not present

## 2023-11-08 DIAGNOSIS — J301 Allergic rhinitis due to pollen: Secondary | ICD-10-CM | POA: Diagnosis not present

## 2023-11-16 DIAGNOSIS — J301 Allergic rhinitis due to pollen: Secondary | ICD-10-CM | POA: Diagnosis not present

## 2023-11-23 DIAGNOSIS — J4530 Mild persistent asthma, uncomplicated: Secondary | ICD-10-CM | POA: Diagnosis not present

## 2023-11-23 DIAGNOSIS — R918 Other nonspecific abnormal finding of lung field: Secondary | ICD-10-CM | POA: Diagnosis not present

## 2023-11-23 DIAGNOSIS — J301 Allergic rhinitis due to pollen: Secondary | ICD-10-CM | POA: Diagnosis not present

## 2023-11-30 DIAGNOSIS — J301 Allergic rhinitis due to pollen: Secondary | ICD-10-CM | POA: Diagnosis not present

## 2023-12-07 DIAGNOSIS — J301 Allergic rhinitis due to pollen: Secondary | ICD-10-CM | POA: Diagnosis not present

## 2023-12-14 DIAGNOSIS — J301 Allergic rhinitis due to pollen: Secondary | ICD-10-CM | POA: Diagnosis not present

## 2023-12-21 DIAGNOSIS — J301 Allergic rhinitis due to pollen: Secondary | ICD-10-CM | POA: Diagnosis not present

## 2023-12-28 DIAGNOSIS — J301 Allergic rhinitis due to pollen: Secondary | ICD-10-CM | POA: Diagnosis not present

## 2024-01-04 DIAGNOSIS — J301 Allergic rhinitis due to pollen: Secondary | ICD-10-CM | POA: Diagnosis not present

## 2024-01-09 DIAGNOSIS — E559 Vitamin D deficiency, unspecified: Secondary | ICD-10-CM | POA: Diagnosis not present

## 2024-01-09 DIAGNOSIS — D509 Iron deficiency anemia, unspecified: Secondary | ICD-10-CM | POA: Diagnosis not present

## 2024-01-09 DIAGNOSIS — Z79899 Other long term (current) drug therapy: Secondary | ICD-10-CM | POA: Diagnosis not present

## 2024-01-09 DIAGNOSIS — E7800 Pure hypercholesterolemia, unspecified: Secondary | ICD-10-CM | POA: Diagnosis not present

## 2024-01-11 DIAGNOSIS — J301 Allergic rhinitis due to pollen: Secondary | ICD-10-CM | POA: Diagnosis not present

## 2024-01-13 DIAGNOSIS — E7800 Pure hypercholesterolemia, unspecified: Secondary | ICD-10-CM | POA: Diagnosis not present

## 2024-01-13 DIAGNOSIS — R609 Edema, unspecified: Secondary | ICD-10-CM | POA: Diagnosis not present

## 2024-01-13 DIAGNOSIS — I471 Supraventricular tachycardia, unspecified: Secondary | ICD-10-CM | POA: Diagnosis not present

## 2024-01-13 DIAGNOSIS — R32 Unspecified urinary incontinence: Secondary | ICD-10-CM | POA: Diagnosis not present

## 2024-01-13 DIAGNOSIS — K449 Diaphragmatic hernia without obstruction or gangrene: Secondary | ICD-10-CM | POA: Diagnosis not present

## 2024-01-13 DIAGNOSIS — K219 Gastro-esophageal reflux disease without esophagitis: Secondary | ICD-10-CM | POA: Diagnosis not present

## 2024-01-13 DIAGNOSIS — M810 Age-related osteoporosis without current pathological fracture: Secondary | ICD-10-CM | POA: Diagnosis not present

## 2024-01-13 DIAGNOSIS — F32A Depression, unspecified: Secondary | ICD-10-CM | POA: Diagnosis not present

## 2024-01-13 DIAGNOSIS — Z9181 History of falling: Secondary | ICD-10-CM | POA: Diagnosis not present

## 2024-01-18 DIAGNOSIS — J301 Allergic rhinitis due to pollen: Secondary | ICD-10-CM | POA: Diagnosis not present

## 2024-01-25 DIAGNOSIS — J301 Allergic rhinitis due to pollen: Secondary | ICD-10-CM | POA: Diagnosis not present

## 2024-02-01 DIAGNOSIS — J301 Allergic rhinitis due to pollen: Secondary | ICD-10-CM | POA: Diagnosis not present

## 2024-02-08 DIAGNOSIS — J301 Allergic rhinitis due to pollen: Secondary | ICD-10-CM | POA: Diagnosis not present

## 2024-02-15 DIAGNOSIS — J301 Allergic rhinitis due to pollen: Secondary | ICD-10-CM | POA: Diagnosis not present

## 2024-02-22 DIAGNOSIS — R918 Other nonspecific abnormal finding of lung field: Secondary | ICD-10-CM | POA: Diagnosis not present

## 2024-02-22 DIAGNOSIS — Q401 Congenital hiatus hernia: Secondary | ICD-10-CM | POA: Diagnosis not present

## 2024-02-22 DIAGNOSIS — J4530 Mild persistent asthma, uncomplicated: Secondary | ICD-10-CM | POA: Diagnosis not present

## 2024-02-22 DIAGNOSIS — J301 Allergic rhinitis due to pollen: Secondary | ICD-10-CM | POA: Diagnosis not present

## 2024-02-29 DIAGNOSIS — J301 Allergic rhinitis due to pollen: Secondary | ICD-10-CM | POA: Diagnosis not present

## 2024-03-07 DIAGNOSIS — J301 Allergic rhinitis due to pollen: Secondary | ICD-10-CM | POA: Diagnosis not present

## 2024-03-14 DIAGNOSIS — J301 Allergic rhinitis due to pollen: Secondary | ICD-10-CM | POA: Diagnosis not present

## 2024-03-21 DIAGNOSIS — J301 Allergic rhinitis due to pollen: Secondary | ICD-10-CM | POA: Diagnosis not present

## 2024-03-28 DIAGNOSIS — J301 Allergic rhinitis due to pollen: Secondary | ICD-10-CM | POA: Diagnosis not present

## 2024-04-04 DIAGNOSIS — J301 Allergic rhinitis due to pollen: Secondary | ICD-10-CM | POA: Diagnosis not present

## 2024-04-11 DIAGNOSIS — J301 Allergic rhinitis due to pollen: Secondary | ICD-10-CM | POA: Diagnosis not present

## 2024-04-17 DIAGNOSIS — J301 Allergic rhinitis due to pollen: Secondary | ICD-10-CM | POA: Diagnosis not present

## 2024-04-17 DIAGNOSIS — R918 Other nonspecific abnormal finding of lung field: Secondary | ICD-10-CM | POA: Diagnosis not present

## 2024-04-17 DIAGNOSIS — J4530 Mild persistent asthma, uncomplicated: Secondary | ICD-10-CM | POA: Diagnosis not present

## 2024-04-18 DIAGNOSIS — J301 Allergic rhinitis due to pollen: Secondary | ICD-10-CM | POA: Diagnosis not present

## 2024-04-25 DIAGNOSIS — J301 Allergic rhinitis due to pollen: Secondary | ICD-10-CM | POA: Diagnosis not present

## 2024-05-02 DIAGNOSIS — J301 Allergic rhinitis due to pollen: Secondary | ICD-10-CM | POA: Diagnosis not present

## 2024-05-09 DIAGNOSIS — J301 Allergic rhinitis due to pollen: Secondary | ICD-10-CM | POA: Diagnosis not present

## 2024-05-16 DIAGNOSIS — J301 Allergic rhinitis due to pollen: Secondary | ICD-10-CM | POA: Diagnosis not present

## 2024-05-23 DIAGNOSIS — J301 Allergic rhinitis due to pollen: Secondary | ICD-10-CM | POA: Diagnosis not present

## 2024-05-23 DIAGNOSIS — J4530 Mild persistent asthma, uncomplicated: Secondary | ICD-10-CM | POA: Diagnosis not present

## 2024-05-23 DIAGNOSIS — Q401 Congenital hiatus hernia: Secondary | ICD-10-CM | POA: Diagnosis not present

## 2024-05-23 DIAGNOSIS — R918 Other nonspecific abnormal finding of lung field: Secondary | ICD-10-CM | POA: Diagnosis not present

## 2024-05-30 DIAGNOSIS — J301 Allergic rhinitis due to pollen: Secondary | ICD-10-CM | POA: Diagnosis not present

## 2024-06-06 DIAGNOSIS — J301 Allergic rhinitis due to pollen: Secondary | ICD-10-CM | POA: Diagnosis not present

## 2024-06-13 DIAGNOSIS — J301 Allergic rhinitis due to pollen: Secondary | ICD-10-CM | POA: Diagnosis not present

## 2024-06-20 DIAGNOSIS — J301 Allergic rhinitis due to pollen: Secondary | ICD-10-CM | POA: Diagnosis not present

## 2024-06-27 DIAGNOSIS — J301 Allergic rhinitis due to pollen: Secondary | ICD-10-CM | POA: Diagnosis not present

## 2024-06-28 DIAGNOSIS — J301 Allergic rhinitis due to pollen: Secondary | ICD-10-CM | POA: Diagnosis not present

## 2024-07-04 DIAGNOSIS — J301 Allergic rhinitis due to pollen: Secondary | ICD-10-CM | POA: Diagnosis not present

## 2024-07-11 DIAGNOSIS — J301 Allergic rhinitis due to pollen: Secondary | ICD-10-CM | POA: Diagnosis not present

## 2024-07-16 DIAGNOSIS — E559 Vitamin D deficiency, unspecified: Secondary | ICD-10-CM | POA: Diagnosis not present

## 2024-07-16 DIAGNOSIS — E7800 Pure hypercholesterolemia, unspecified: Secondary | ICD-10-CM | POA: Diagnosis not present

## 2024-07-16 DIAGNOSIS — D509 Iron deficiency anemia, unspecified: Secondary | ICD-10-CM | POA: Diagnosis not present

## 2024-07-16 DIAGNOSIS — Z79899 Other long term (current) drug therapy: Secondary | ICD-10-CM | POA: Diagnosis not present

## 2024-07-18 DIAGNOSIS — J301 Allergic rhinitis due to pollen: Secondary | ICD-10-CM | POA: Diagnosis not present

## 2024-07-25 DIAGNOSIS — J301 Allergic rhinitis due to pollen: Secondary | ICD-10-CM | POA: Diagnosis not present

## 2024-07-31 DIAGNOSIS — Z23 Encounter for immunization: Secondary | ICD-10-CM | POA: Diagnosis not present

## 2024-07-31 DIAGNOSIS — K219 Gastro-esophageal reflux disease without esophagitis: Secondary | ICD-10-CM | POA: Diagnosis not present

## 2024-07-31 DIAGNOSIS — I471 Supraventricular tachycardia, unspecified: Secondary | ICD-10-CM | POA: Diagnosis not present

## 2024-07-31 DIAGNOSIS — E7800 Pure hypercholesterolemia, unspecified: Secondary | ICD-10-CM | POA: Diagnosis not present

## 2024-07-31 DIAGNOSIS — J45909 Unspecified asthma, uncomplicated: Secondary | ICD-10-CM | POA: Diagnosis not present

## 2024-07-31 DIAGNOSIS — Z139 Encounter for screening, unspecified: Secondary | ICD-10-CM | POA: Diagnosis not present

## 2024-07-31 DIAGNOSIS — M810 Age-related osteoporosis without current pathological fracture: Secondary | ICD-10-CM | POA: Diagnosis not present

## 2024-07-31 DIAGNOSIS — R609 Edema, unspecified: Secondary | ICD-10-CM | POA: Diagnosis not present

## 2024-07-31 DIAGNOSIS — F32A Depression, unspecified: Secondary | ICD-10-CM | POA: Diagnosis not present

## 2024-07-31 DIAGNOSIS — R32 Unspecified urinary incontinence: Secondary | ICD-10-CM | POA: Diagnosis not present

## 2024-08-01 DIAGNOSIS — J301 Allergic rhinitis due to pollen: Secondary | ICD-10-CM | POA: Diagnosis not present

## 2024-08-08 DIAGNOSIS — J301 Allergic rhinitis due to pollen: Secondary | ICD-10-CM | POA: Diagnosis not present

## 2024-08-15 DIAGNOSIS — J301 Allergic rhinitis due to pollen: Secondary | ICD-10-CM | POA: Diagnosis not present
